# Patient Record
Sex: Female | Born: 1950 | Race: White | Hispanic: No | Marital: Married | Smoking: Never smoker
Health system: Southern US, Community
[De-identification: ages and names within clinical notes are randomized; demographics above are authoritative.]

## PROBLEM LIST (undated history)

## (undated) DIAGNOSIS — K5790 Diverticulosis of intestine, part unspecified, without perforation or abscess without bleeding: Secondary | ICD-10-CM

## (undated) DIAGNOSIS — S0300XA Dislocation of jaw, unspecified side, initial encounter: Secondary | ICD-10-CM

## (undated) DIAGNOSIS — K219 Gastro-esophageal reflux disease without esophagitis: Secondary | ICD-10-CM

## (undated) DIAGNOSIS — N3010 Interstitial cystitis (chronic) without hematuria: Secondary | ICD-10-CM

## (undated) DIAGNOSIS — E079 Disorder of thyroid, unspecified: Secondary | ICD-10-CM

## (undated) DIAGNOSIS — E039 Hypothyroidism, unspecified: Secondary | ICD-10-CM

## (undated) DIAGNOSIS — E7800 Pure hypercholesterolemia, unspecified: Secondary | ICD-10-CM

## (undated) DIAGNOSIS — M1990 Unspecified osteoarthritis, unspecified site: Secondary | ICD-10-CM

## (undated) DIAGNOSIS — N3289 Other specified disorders of bladder: Secondary | ICD-10-CM

## (undated) DIAGNOSIS — G4730 Sleep apnea, unspecified: Secondary | ICD-10-CM

## (undated) DIAGNOSIS — J45909 Unspecified asthma, uncomplicated: Secondary | ICD-10-CM

## (undated) DIAGNOSIS — M797 Fibromyalgia: Secondary | ICD-10-CM

## (undated) HISTORY — DX: Disorder of thyroid, unspecified: E07.9

## (undated) HISTORY — DX: Gastro-esophageal reflux disease without esophagitis: K21.9

## (undated) HISTORY — DX: Fibromyalgia: M79.7

## (undated) HISTORY — PX: ABDOMINAL HYSTERECTOMY: SHX81

## (undated) HISTORY — DX: Diverticulosis of intestine, part unspecified, without perforation or abscess without bleeding: K57.90

## (undated) HISTORY — PX: CERVICAL FUSION: SHX112

## (undated) HISTORY — DX: Interstitial cystitis (chronic) without hematuria: N30.10

## (undated) HISTORY — PX: BREAST BIOPSY: SHX20

## (undated) HISTORY — PX: BREAST SURGERY: SHX581

## (undated) HISTORY — DX: Other specified disorders of bladder: N32.89

---

## 1998-06-20 ENCOUNTER — Other Ambulatory Visit: Admission: RE | Admit: 1998-06-20 | Discharge: 1998-06-20 | Payer: Self-pay | Admitting: Obstetrics and Gynecology

## 1998-08-13 ENCOUNTER — Encounter: Payer: Self-pay | Admitting: Neurosurgery

## 1998-08-13 ENCOUNTER — Ambulatory Visit (HOSPITAL_COMMUNITY): Admission: RE | Admit: 1998-08-13 | Discharge: 1998-08-13 | Payer: Self-pay | Admitting: Neurosurgery

## 1999-09-17 ENCOUNTER — Encounter: Payer: Self-pay | Admitting: Neurology

## 1999-09-17 ENCOUNTER — Ambulatory Visit (HOSPITAL_COMMUNITY): Admission: RE | Admit: 1999-09-17 | Discharge: 1999-09-17 | Payer: Self-pay | Admitting: Neurology

## 2000-06-25 ENCOUNTER — Ambulatory Visit (HOSPITAL_COMMUNITY): Admission: RE | Admit: 2000-06-25 | Discharge: 2000-06-25 | Payer: Self-pay | Admitting: Obstetrics and Gynecology

## 2000-06-25 ENCOUNTER — Encounter: Payer: Self-pay | Admitting: Obstetrics and Gynecology

## 2001-09-28 ENCOUNTER — Encounter: Payer: Self-pay | Admitting: Obstetrics and Gynecology

## 2001-09-28 ENCOUNTER — Ambulatory Visit (HOSPITAL_COMMUNITY): Admission: RE | Admit: 2001-09-28 | Discharge: 2001-09-28 | Payer: Self-pay | Admitting: Obstetrics and Gynecology

## 2001-10-15 ENCOUNTER — Other Ambulatory Visit: Admission: RE | Admit: 2001-10-15 | Discharge: 2001-10-15 | Payer: Self-pay | Admitting: Obstetrics and Gynecology

## 2002-11-15 ENCOUNTER — Ambulatory Visit (HOSPITAL_COMMUNITY): Admission: RE | Admit: 2002-11-15 | Discharge: 2002-11-15 | Payer: Self-pay | Admitting: Obstetrics and Gynecology

## 2002-11-15 ENCOUNTER — Encounter: Payer: Self-pay | Admitting: Obstetrics and Gynecology

## 2004-03-15 ENCOUNTER — Ambulatory Visit (HOSPITAL_COMMUNITY): Admission: RE | Admit: 2004-03-15 | Discharge: 2004-03-15 | Payer: Self-pay | Admitting: Obstetrics and Gynecology

## 2005-06-26 ENCOUNTER — Ambulatory Visit (HOSPITAL_COMMUNITY): Admission: RE | Admit: 2005-06-26 | Discharge: 2005-06-26 | Payer: Self-pay | Admitting: Obstetrics and Gynecology

## 2005-07-08 ENCOUNTER — Other Ambulatory Visit: Admission: RE | Admit: 2005-07-08 | Discharge: 2005-07-08 | Payer: Self-pay | Admitting: Obstetrics and Gynecology

## 2007-01-14 ENCOUNTER — Ambulatory Visit (HOSPITAL_COMMUNITY): Admission: RE | Admit: 2007-01-14 | Discharge: 2007-01-14 | Payer: Self-pay | Admitting: Obstetrics and Gynecology

## 2007-03-17 ENCOUNTER — Ambulatory Visit (HOSPITAL_BASED_OUTPATIENT_CLINIC_OR_DEPARTMENT_OTHER): Admission: RE | Admit: 2007-03-17 | Discharge: 2007-03-17 | Payer: Self-pay | Admitting: Urology

## 2007-03-17 ENCOUNTER — Encounter (INDEPENDENT_AMBULATORY_CARE_PROVIDER_SITE_OTHER): Payer: Self-pay | Admitting: Urology

## 2007-12-24 ENCOUNTER — Ambulatory Visit: Payer: Self-pay | Admitting: Gastroenterology

## 2008-01-28 ENCOUNTER — Ambulatory Visit: Payer: Self-pay | Admitting: Gastroenterology

## 2008-02-03 ENCOUNTER — Ambulatory Visit (HOSPITAL_COMMUNITY): Admission: RE | Admit: 2008-02-03 | Discharge: 2008-02-03 | Payer: Self-pay | Admitting: Obstetrics and Gynecology

## 2009-02-18 ENCOUNTER — Inpatient Hospital Stay (HOSPITAL_COMMUNITY): Admission: AD | Admit: 2009-02-18 | Discharge: 2009-02-20 | Payer: Self-pay | Admitting: Orthopedic Surgery

## 2010-02-28 ENCOUNTER — Ambulatory Visit (HOSPITAL_COMMUNITY): Admission: RE | Admit: 2010-02-28 | Discharge: 2010-02-28 | Payer: Self-pay | Admitting: Obstetrics and Gynecology

## 2010-12-31 ENCOUNTER — Ambulatory Visit (INDEPENDENT_AMBULATORY_CARE_PROVIDER_SITE_OTHER): Payer: 59 | Admitting: Urology

## 2010-12-31 DIAGNOSIS — N362 Urethral caruncle: Secondary | ICD-10-CM

## 2010-12-31 DIAGNOSIS — N3020 Other chronic cystitis without hematuria: Secondary | ICD-10-CM

## 2011-02-25 NOTE — Op Note (Signed)
NAMESHANEESE, TAIT NO.:  [id number]   MEDICAL RECORD NO.:  [id number]          PATIENT TYPE:  AMB   LOCATION:  [facility]                         FACILITY:  [facility]   PHYSICIAN:  Maretta Bees. Vonita Moss, M.D.DATE OF BIRTH:  1951/09/07   DATE OF PROCEDURE:  03/17/2007  DATE OF DISCHARGE:                               OPERATIVE REPORT   PREOPERATIVE DIAGNOSIS:  Rule out interstitial cystitis.   POSTOPERATIVE DIAGNOSES:  1. Rule out interstitial cystitis.  2. Urethral stenosis.   PROCEDURE:  Cystoscopy, urethral dilation, hydraulic overdistention of  bladder and cold cup bladder biopsy.   SURGEON:  Dr. Larey Dresser.   ANESTHESIA:  General.   INDICATIONS:  This 60 year old lady has had a long history of bladder  pressure,  frequency and urgency and has a pelvic pain symptom score of  12. I have some concern about interstitial cystitis and she is brought  to the OR today for further evaluation and workup.   PROCEDURE:  The patient was brought to the operating room, placed in  lithotomy position, the external genitalia were prepped and draped in  the usual fashion.  She was cystoscoped but only after her urethra was  dilated with a 30-French. The bladder looked perfectly normal in looking  in and then I filled her to 850 mL and reexamination of the bladder  revealed scattered submucosal petechiae and some hemorrhage. Cold cup  bladder biopsies were obtained from hemorrhagic areas and the biopsy  sites fulgurated. The findings were consistent with interstitial  cystitis.  She was then taken to recovery room in good condition.      Maretta Bees. Vonita Moss, M.D.  Electronically Signed     LJP/MEDQ  D:  03/17/2007  T:  03/17/2007  Job:  [id number]

## 2011-02-25 NOTE — Discharge Summary (Signed)
NAMESHANTAY, SONN NO.:  [id number]   MEDICAL RECORD NO.:  [id number]          PATIENT TYPE:  INP   LOCATION:  1525                         FACILITY:  [facility]   PHYSICIAN:  Alvy Beal, MD    DATE OF BIRTH:  Dec 19, 1950   DATE OF ADMISSION:  02/18/2009  DATE OF DISCHARGE:  02/20/2009                               DISCHARGE SUMMARY   ADMISSION DIAGNOSIS:  Horrific low back pain with bilateral L5 pars  defects.   DISCHARGE DIAGNOSIS:  Same.   CONSULTATIONS:  None.   PROCEDURE:  Lumbar epidural steroid injection on Feb 19, 2009.   BRIEF HISTORY:  Ms. Delval is a very pleasant 60 year old female with on-  and-off again low back pain for a number of years.  She initially  presented to Dr. Shon Baton' office on Feb 15, 2009 with horrific back pain.  She at that time was started on some on narcotic pain medication and  unfortunately this did not provide her with any relief, therefore she  was admitted in to the hospital on Feb 18, 2009 for pain control and to  undergo a lumbar MRI.  Hospital day number day #1 patient did undergo a  lumbar MRI which was reviewed by Dr. Shon Baton, it demonstrated bilateral  pars defects at the L5 with the 2 mm of anterior listhesis with mild  foraminal narrowing that would possibly irritate the right L5 nerve  root.  Based off of these results Dr. Shon Baton recommended a lumbar  epidural steroid injection which was done later that evening.  Post  procedure day #1 which is hospital stay #3 on Feb 20, 2009 the patient  states her back pain was improved, her leg pain was improved.  She was  still having some occasional side pain but all-in-all she feels much  better.  The patient was fitted for a flexion molded LSO brace to help  reduce the slip at the L5 level.  Since the patient's vital signs  remained stable, she was afebrile, neurovascularly intact and her pain  was now controlled with oral medication and the lumbar epidural steroid  injection the patient was deemed stable to be discharged home.   DISCHARGE MEDICATIONS:  1. Hydrocodone 5/325 one every 6 hours as needed for pain.  2. Cyclobenzaprine 10 mg one tablet at night.  3. Levothyroxine 75 mcg one daily.  4. __________ one in the morning and one in the evening.  5. The patient is instructed to stop her prednisone Dosepak.   The patient had no medications upon discharge.   INSTRUCTIONS:  The patient is instructed on back precautions, no  bending, stooping, twisting or squatting.  She is not to lift anything  heavier than 6 pounds.  She may take her brace off to shower but then  she needs to place her brace back on.  She can increase activity slowly.  She may walk up steps.  She is to walk as often as tolerated.   FOLLOW-UP INSTRUCTIONS:  The patient is instructed to follow up with Dr.  Shon Baton in his office in approximately 1  week.  The patient will call our  office if she is having any increased pain, new radicular-type leg pain,  any fevers, chills, difficulty with bowel or bladder problems.  The  patient is instructed to schedule her follow-up appointment by calling  the office at [phone].      Crissie Reese, PA      Alvy Beal, MD  Electronically Signed    AC/MEDQ  D:  02/20/2009  T:  02/20/2009  Job:  [id number]

## 2011-07-31 LAB — POCT HEMOGLOBIN-HEMACUE: Hemoglobin: 12.8

## 2011-11-27 ENCOUNTER — Other Ambulatory Visit (INDEPENDENT_AMBULATORY_CARE_PROVIDER_SITE_OTHER): Payer: BC Managed Care – PPO

## 2011-11-27 ENCOUNTER — Encounter: Payer: Self-pay | Admitting: Physician Assistant

## 2011-11-27 ENCOUNTER — Telehealth: Payer: Self-pay | Admitting: Gastroenterology

## 2011-11-27 ENCOUNTER — Ambulatory Visit (INDEPENDENT_AMBULATORY_CARE_PROVIDER_SITE_OTHER): Payer: BC Managed Care – PPO | Admitting: Physician Assistant

## 2011-11-27 ENCOUNTER — Ambulatory Visit (INDEPENDENT_AMBULATORY_CARE_PROVIDER_SITE_OTHER)
Admission: RE | Admit: 2011-11-27 | Discharge: 2011-11-27 | Disposition: A | Discharge status: Home / Self Care | Payer: BC Managed Care – PPO | Source: Ambulatory Visit | Attending: Physician Assistant | Admitting: Physician Assistant

## 2011-11-27 VITALS — BP 132/64 | HR 92 | Temp 98.3°F | Ht 68.0 in | Wt 175.0 lb

## 2011-11-27 DIAGNOSIS — K5730 Diverticulosis of large intestine without perforation or abscess without bleeding: Secondary | ICD-10-CM

## 2011-11-27 DIAGNOSIS — R1032 Left lower quadrant pain: Secondary | ICD-10-CM

## 2011-11-27 DIAGNOSIS — N3010 Interstitial cystitis (chronic) without hematuria: Secondary | ICD-10-CM | POA: Insufficient documentation

## 2011-11-27 DIAGNOSIS — K5790 Diverticulosis of intestine, part unspecified, without perforation or abscess without bleeding: Secondary | ICD-10-CM | POA: Insufficient documentation

## 2011-11-27 LAB — BASIC METABOLIC PANEL
Calcium: 9.3 mg/dL (ref 8.4–10.5)
GFR: 64.03 mL/min (ref >60.00)
Glucose, Bld: 97 mg/dL (ref 70–99)
Potassium: 3.5 mEq/L (ref 3.5–5.1)
Sodium: 140 mEq/L (ref 135–145)

## 2011-11-27 LAB — CBC WITH DIFFERENTIAL/PLATELET
Basophils Absolute: 0 10*3/uL (ref 0.0–0.1)
Eosinophils Relative: 1.4 % (ref 0.0–5.0)
HCT: 37.5 % (ref 36.0–46.0)
Lymphs Abs: 3.1 10*3/uL (ref 0.7–4.0)
MCV: 89.7 fl (ref 78.0–100.0)
Monocytes Absolute: 1.2 10*3/uL — ABNORMAL HIGH (ref 0.1–1.0)
Monocytes Relative: 9.3 % (ref 3.0–12.0)
Neutrophils Relative %: 64.5 % (ref 43.0–77.0)
Platelets: 275 10*3/uL (ref 150.0–400.0)
RDW: 13.2 % (ref 11.5–14.6)
WBC: 12.8 10*3/uL — ABNORMAL HIGH (ref 4.5–10.5)

## 2011-11-27 MED ORDER — HYDROCODONE-ACETAMINOPHEN 5-500 MG PO TABS: 1.0000 | ORAL_TABLET | ORAL | Status: AC | PRN

## 2011-11-27 MED ORDER — CIPROFLOXACIN HCL 500 MG PO TABS: 500.0000 mg | ORAL_TABLET | Freq: Two times a day (BID) | ORAL | Status: AC

## 2011-11-27 MED ORDER — METRONIDAZOLE 500 MG PO TABS: 500.0000 mg | ORAL_TABLET | Freq: Two times a day (BID) | ORAL | Status: AC

## 2011-11-27 MED ORDER — IOHEXOL 300 MG/ML  SOLN
100.0000 mL | Freq: Once | INTRAMUSCULAR | Status: AC | PRN
  Administered 2011-11-27: 100 mL via INTRAVENOUS

## 2011-11-27 NOTE — Telephone Encounter (Signed)
Patient with a several day history of abdominal pain that has gotten progressively worse.  She reports mucus and blood in her stool, she is having problems walking due to the pain.  I have scheduled an appt for her today to be seen at 2:00 by Mike Gip PA.

## 2011-11-27 NOTE — Patient Instructions (Addendum)
  You have been scheduled for a CT scan of the abdomen and pelvis at [facility] ([address]---this is in the same building as [facility]).   You are scheduled on 11-27-11 at 4:00pm. You should arrive 15 minutes prior to your appointment time for registration. Please follow the written instructions below on the day of your exam:  WARNING: IF YOU ARE ALLERGIC TO IODINE/X-RAY DYE, PLEASE NOTIFY RADIOLOGY IMMEDIATELY AT [phone]! YOU WILL BE GIVEN A 13 HOUR PREMEDICATION PREP.  1) Do not eat or drink anything after 2:45pm.  2) You have been given 2 bottles of oral contrast to drink. The solution may taste better if refrigerated, but do NOT add ice or any other liquid to this solution. Shake  well before drinking.    Drink 1 bottle of contrast @ 2:45pm (2 hours prior to your exam)  Drink 1 bottle of contrast @ 3:45pm (1 hour prior to your exam)  You may take any medications as prescribed with a small amount of water except for the following: Metformin, Glucophage, Glucovance, Avandamet, Riomet, Fortamet, Actoplus Met, Janumet, Glumetza or Metaglip. The above medications must be held the day of the exam AND 48 hours after the exam. Please go directly to the lab for lab work to be done.    The purpose of you drinking the oral contrast is to aid in the visualization of your intestinal tract. The contrast solution may cause some diarrhea. Before your exam is started, you will be given a small amount of fluid to drink. Depending on your individual set of symptoms, you may also receive an intravenous injection of x-ray contrast/dye. Plan on being at [facility] for 30 minutes or long, depending on the type of exam you are having performed.  If you have any questions regarding your exam or if you need to reschedule, you may call the CT department at [phone] between the hours of 8:00 am and 5:00 pm, Monday-Friday.  We have sent the antibiotic prescriptions to your  pharmacy and given you a printed prescription for Vicodin to take with you. ________________________________________________________________________

## 2011-11-27 NOTE — Progress Notes (Signed)
Subjective:    Patient ID: Kathleen Wall, female    DOB: 10-Jul-1951, 61 y.o.   MRN: [id number]  HPI Amberlin is a pleasant3 year old female known to Dr. Russella Dar who underwent colonoscopy in April of 2009 for screening. This was normal with the exception of sigmoid colon diverticulosis. We have not seen her since then. She called earlier today after acute onset last night with sharp lower abdominal pain followed by passage of a mucousy stool with a small amount of blood noted on the tissue. She had a second episode in that time, again with a small amount of mucus and blood on the tissue. She continued to hurt last night and says that she is very uncomfortable with walking. She has not had any documented fever or chills no nausea or vomiting but her appetite has been somewhat decreased. Patient says that she has history of interstitial cystitis and when her symptoms started last night she thought it was her bladder but since feels that her pain is different. She had started her medication for the interstitial cystitis and that did not help either.  Patient is status post complete hysterectomy and has history of endometriosis as well.    Review of Systems  Constitutional: Positive for appetite change.  HENT: Negative for congestion.   Eyes: Negative for discharge.  Respiratory: Negative.   Gastrointestinal: Positive for abdominal pain, diarrhea and blood in stool.  Genitourinary: Positive for dysuria.  Musculoskeletal: Negative.   Neurological: Negative.   Hematological: Negative.   Psychiatric/Behavioral: Negative.    Outpatient Encounter Prescriptions as of 11/27/2011  Medication Sig Dispense Refill  . AMBULATORY NON FORMULARY MEDICATION Uralac Takes as directed      . ASPIRIN PO Take 1 tablet by mouth as directed.      Vedia Coffer Cohosh (REMIFEMIN PO) Take by mouth as directed.      Marland Kitchen CALCIUM PO Take 1 tablet by mouth daily.      . Cyclobenzaprine HCl (FLEXERIL PO) Take 1 tablet by mouth at  bedtime.      Marland Kitchen levothyroxine (SYNTHROID, LEVOTHROID) 75 MCG tablet Take 75 mcg by mouth daily.      . Omega-3 Fatty Acids (FISH OIL PO) Take 1 capsule by mouth daily.      . Red Yeast Rice Extract (RED YEAST RICE PO) Take 1 capsule by mouth daily.      . ciprofloxacin (CIPRO) 500 MG tablet Take 1 tablet (500 mg total) by mouth 2 (two) times daily.  20 tablet  0  . HYDROcodone-acetaminophen (VICODIN) 5-500 MG per tablet Take 1 tablet by mouth every 4 (four) hours as needed for pain.  20 tablet  0  . metroNIDAZOLE (FLAGYL) 500 MG tablet Take 1 tablet (500 mg total) by mouth 2 (two) times daily with a meal.  21 tablet  0    No Known Allergies Active Ambulatory Problems    Diagnosis Date Noted  . Diverticulosis 11/27/2011  . Interstitial cystitis 11/27/2011   Resolved Ambulatory Problems    Diagnosis Date Noted  . No Resolved Ambulatory Problems   Past Medical History  Diagnosis Date  . Diverticular disease   . Thyroid disease   . Irritable bladder        Objective:   Physical Exam white female in no acute distress, alert and oriented x3 pleasant. Temp 90 833 ;nontraumatic ,normocephalic, EOMI, PERRLA, sclera anicteric,neck; Supple no JVD, Cardiovascular; regular rate and rhythm with S1-S2 no murmur gallop, Pulmonary; clear bilaterally, Abdomen; soft bowel sounds are  present but somewhat hypoactive she is quite tender in the left lower quadrant and suprapubic area, no guarding, positive rebound, no palpable masses or hepatosplenomegaly, Rectal; not done, Extremities; no clubbing cyanosis or edema, Psych; mood and affect normal and appropriate        Assessment & Plan:  #30 61 year old female with acute left lower quadrant and suprapubic pain onset yesterday. I suspect she has an acute diverticulitis with some peritoneal irritation given rebound on exam. She states that she passed a small amount of blood with her bowel movement last evening but has not had any evidence of any active  bleeding.  Plan; check CBC and BMET now. Schedule for CT scan of abdomen and pelvis with in the next 24 hours  Patient to start a soft bland diet Vicodin 5 501 every 6 hours as needed for pain Start Cipro 500 mg by mouth twice daily x14 days and Flagyl 500 mg by mouth twice daily x14 days. Further plans pending results of CT.  #2 previously documented diverticulosis #3 history of interstitial cystitis

## 2011-11-28 ENCOUNTER — Other Ambulatory Visit: Payer: BC Managed Care – PPO

## 2011-11-28 NOTE — Progress Notes (Signed)
Agree with initial assessment and plans. Most likely diverticulitis vs ischemic colitis. Agree w/ antibiotics and CT

## 2011-12-01 ENCOUNTER — Telehealth: Payer: Self-pay

## 2011-12-01 NOTE — Telephone Encounter (Signed)
Message copied by Annett Fabian on Mon Dec 01, 2011  9:41 AM ------      Message from: [place], [place] S      Created: Thu Nov 27, 2011  4:54 PM       Breck Hollinger-pt has acute diverticulitis- please call her on Monday to make sure she is  Feeling better- thanks

## 2011-12-01 NOTE — Telephone Encounter (Signed)
Patient reports her symptoms are improving daily.  She is still having some left lower quad discomfort, but not using the vicodin at work.  She was asking what she can take.  She is advised she can use OTC pain meds.  She is asked to call back if her symptoms worsen or don't resolve by the time she completes her antibiotics.  She is having some constipation, she is advised she can take MOM or a stool softener.  She is tolerating a regular diet.

## 2012-01-06 ENCOUNTER — Other Ambulatory Visit (HOSPITAL_COMMUNITY): Payer: Self-pay | Admitting: Obstetrics and Gynecology

## 2012-01-06 DIAGNOSIS — Z1231 Encounter for screening mammogram for malignant neoplasm of breast: Secondary | ICD-10-CM

## 2012-01-07 ENCOUNTER — Ambulatory Visit (HOSPITAL_COMMUNITY): Payer: BC Managed Care – PPO

## 2012-01-07 ENCOUNTER — Ambulatory Visit (HOSPITAL_COMMUNITY)
Admission: RE | Admit: 2012-01-07 | Discharge: 2012-01-07 | Disposition: A | Discharge status: Home / Self Care | Payer: BC Managed Care – PPO | Source: Ambulatory Visit | Attending: Obstetrics and Gynecology | Admitting: Obstetrics and Gynecology

## 2012-01-07 DIAGNOSIS — Z1231 Encounter for screening mammogram for malignant neoplasm of breast: Secondary | ICD-10-CM | POA: Insufficient documentation

## 2013-02-18 ENCOUNTER — Other Ambulatory Visit: Payer: Self-pay | Admitting: Obstetrics and Gynecology

## 2014-06-27 ENCOUNTER — Ambulatory Visit (INDEPENDENT_AMBULATORY_CARE_PROVIDER_SITE_OTHER): Payer: BC Managed Care – PPO | Admitting: Gastroenterology

## 2014-06-27 ENCOUNTER — Encounter: Payer: Self-pay | Admitting: Gastroenterology

## 2014-06-27 VITALS — BP 140/80 | HR 74 | Ht 67.0 in | Wt 175.6 lb

## 2014-06-27 DIAGNOSIS — R1319 Other dysphagia: Secondary | ICD-10-CM

## 2014-06-27 DIAGNOSIS — K219 Gastro-esophageal reflux disease without esophagitis: Secondary | ICD-10-CM

## 2014-06-27 DIAGNOSIS — Z800 Family history of malignant neoplasm of digestive organs: Secondary | ICD-10-CM

## 2014-06-27 DIAGNOSIS — R109 Unspecified abdominal pain: Secondary | ICD-10-CM

## 2014-06-27 DIAGNOSIS — K921 Melena: Secondary | ICD-10-CM

## 2014-06-27 MED ORDER — PEG-KCL-NACL-NASULF-NA ASC-C 100 G PO SOLR: 1.0000 | Freq: Once | ORAL | Status: DC

## 2014-06-27 NOTE — Patient Instructions (Addendum)
You have been scheduled for an endoscopy and colonoscopy. Please follow the written instructions given to you at your visit today. Please pick up your prep at the pharmacy within the next 1-3 days. If you use inhalers (even only as needed), please bring them with you on the day of your procedure. Your physician has requested that you go to www.startemmi.com and enter the access code given to you at your visit today. This web site gives a general overview about your procedure. However, you should still follow specific instructions given to you by our office regarding your preparation for the procedure.  Start taking your omeprazole 20 mg daily that you have already purchased and is at your home.  Thank you for choosing me and [facility].  Pricilla Riffle. Dagoberto Ligas., MD., Marval Regal  cc: Rory Percy, MD

## 2014-06-27 NOTE — Progress Notes (Signed)
    History of Present Illness: This is a 63 year old female referred by Dr. Nadara Mustard with multiple GI complaints. She notes frequent dull pain in her suprapubic area, occasional alternating diarrhea and constipation, occasional small volume rectal bleeding, occasional solid food dysphagia and frequent lower sternal pain. In addition her brother was recently diagnosed with stage IV colon cancer age 61. She previously underwent colonoscopy in April 2009 which was normal except for mild sigmoid colon diverticulosis. She takes omeprazole as needed which is helpful for her lower sternal pain. She has had intermittent solid food dysphagia for the past 3 or 4 months. Denies weight loss, change in stool caliber, melena, hematochezia, nausea, vomiting.   Current Medications, Allergies, Past Medical History, Past Surgical History, Family History and Social History were reviewed in [employer] record.  Physical Exam: General: Well developed , well nourished, no acute distress Head: Normocephalic and atraumatic Eyes:  sclerae anicteric, EOMI Ears: Normal auditory acuity Mouth: No deformity or lesions Lungs: Clear throughout to auscultation Heart: Regular rate and rhythm; no murmurs, rubs or bruits Abdomen: Soft, non tender and non distended. No masses, hepatosplenomegaly or hernias noted. Normal Bowel sounds Rectal: Deferred to colonoscopy  Musculoskeletal: Symmetrical with no gross deformities  Pulses:  Normal pulses noted Extremities: No clubbing, cyanosis, edema or deformities noted Neurological: Alert oriented x 4, grossly nonfocal Psychological:  Alert and cooperative. Normal mood and affect  Assessment and Recommendations:  1. Family history of colon cancer, small-volume hematochezia, variable bowel habits, suprapubic pain. Her suprapubic pain may be related to interstitial cystitis. Schedule colonoscopy. The risks, benefits, and alternatives to colonoscopy with possible  biopsy and possible polypectomy were discussed with the patient and they consent to proceed.   2. GERD with new onset solid food dysphagia. Rule out esophagitis, stricture. Take omeprazole 20 mg daily. Begin standard antireflux measures. Schedule upper endoscopy possible dilation. The risks, benefits, and alternatives to endoscopy with possible biopsy and possible dilation were discussed with the patient and they consent to proceed.

## 2014-07-05 ENCOUNTER — Ambulatory Visit (AMBULATORY_SURGERY_CENTER): Payer: BC Managed Care – PPO | Admitting: Gastroenterology

## 2014-07-05 ENCOUNTER — Encounter: Payer: Self-pay | Admitting: Gastroenterology

## 2014-07-05 ENCOUNTER — Encounter: Payer: BC Managed Care – PPO | Admitting: Gastroenterology

## 2014-07-05 VITALS — BP 156/70 | HR 66 | Temp 98.0°F | Resp 21 | Ht 67.0 in | Wt 175.0 lb

## 2014-07-05 DIAGNOSIS — K921 Melena: Secondary | ICD-10-CM

## 2014-07-05 DIAGNOSIS — Z800 Family history of malignant neoplasm of digestive organs: Secondary | ICD-10-CM

## 2014-07-05 DIAGNOSIS — K219 Gastro-esophageal reflux disease without esophagitis: Secondary | ICD-10-CM

## 2014-07-05 DIAGNOSIS — R1319 Other dysphagia: Secondary | ICD-10-CM

## 2014-07-05 DIAGNOSIS — K222 Esophageal obstruction: Secondary | ICD-10-CM

## 2014-07-05 MED ORDER — SODIUM CHLORIDE 0.9 % IV SOLN: 500.0000 mL | INTRAVENOUS | Status: DC

## 2014-07-05 NOTE — Op Note (Signed)
[place]  [address] [place], [id number]   COLONOSCOPY PROCEDURE REPORT  PATIENT: Kathleen, Wall  MR#: [id number] BIRTHDATE: 12-04-1950 , 12  yrs. old GENDER: female ENDOSCOPIST: Ladene Artist, MD, [facility] PROCEDURE DATE:  07/05/2014 PROCEDURE:   Colonoscopy, diagnostic First Screening Colonoscopy - Avg.  risk and is 50 yrs.  old or older - No.  Prior Negative Screening - Now for repeat screening. N/A  History of Adenoma - Now for follow-up colonoscopy & has been > or = to 3 yrs.  N/A  Polyps Removed Today? No.  Recommend repeat exam, <10 yrs? Polyps Removed Today? No.  Recommend repeat exam, <10 yrs? Yes.  High risk (family or personal hx). ASA CLASS:   Class II INDICATIONS:hematochezia and patient's immediate family history of colon cancer. MEDICATIONS: Monitored anesthesia care and Propofol 200 mg DESCRIPTION OF PROCEDURE:   After the risks benefits and alternatives of the procedure were thoroughly explained, informed consent was obtained.  The digital rectal exam revealed no abnormalities of the rectum.   The LB [id number]  endoscope was introduced through the anus and advanced to the cecum, which was identified by both the appendix and ileocecal valve. No adverse events experienced.   The quality of the prep was good, using MoviPrep  The instrument was then slowly withdrawn as the colon was fully examined.    COLON FINDINGS: There was moderate diverticulosis noted in the sigmoid colon and descending colon.   The colon mucosa was otherwise normal.  Retroflexed views revealed small external hemorrhoids. The time to cecum=2 minutes 18 seconds.  Withdrawal time=8 minutes 40 seconds.  The scope was withdrawn and the procedure completed.  COMPLICATIONS: There were no complications.   ENDOSCOPIC IMPRESSION: 1.   Moderate diverticulosis in the sigmoid colon and descending colon 2.   Small external hemorrhoids  RECOMMENDATIONS: 1.  High fiber  diet with liberal fluid intake. 2.  Repeat Colonoscopy in 5 years.  eSigned:  Ladene Artist, MD, [facility] 07/05/2014 1:47 PM   cc: Rory Percy, MD   PATIENT NAME:  Kathleen, Wall MR#: [id number]

## 2014-07-05 NOTE — Progress Notes (Signed)
Called to room to assist during endoscopic procedure.  Patient ID and intended procedure confirmed with present staff. Received instructions for my participation in the procedure from the performing physician.  

## 2014-07-05 NOTE — Op Note (Signed)
[place]  [address]. [place], [id number]   ENDOSCOPY PROCEDURE REPORT  PATIENT: Kathleen Wall, Kathleen Wall  MR#: [id number] BIRTHDATE: 1951/07/24 , 23  yrs. old GENDER: female ENDOSCOPIST: Ladene Artist, MD, [facility] PROCEDURE DATE:  07/05/2014 PROCEDURE:  EGD w/ wire guided (savary) dilation ASA CLASS:     Class II INDICATIONS:  dysphagia and history of esophageal reflux. MEDICATIONS: Monitored anesthesia care and Propofol 100 mg and residual sedation present TOPICAL ANESTHETIC: none DESCRIPTION OF PROCEDURE: After the risks benefits and alternatives of the procedure were thoroughly explained, informed consent was obtained.  The LB [id number] endoscope was introduced through the mouth and advanced to the second portion of the duodenum , limited by Without limitations. The instrument was slowly withdrawn as the mucosa was fully examined.  ESOPHAGUS: There was a mild and benign appearing stricture with an inner diameter of 15 mm at the gastroesophageal junction.  The stricture was easily traversable.  The stricture was dilated using a 59mm (51Fr) savary dilator over guidewire.   The esophagus was otherwise normal. STOMACH: The mucosa and folds of the stomach appeared normal. DUODENUM: The duodenal mucosa showed no abnormalities in the bulb and 2nd part of the duodenum.  Retroflexed views revealed a small hiatal hernia.  The scope was then withdrawn from the patient and the procedure completed.  COMPLICATIONS: There were no complications.  ENDOSCOPIC IMPRESSION: 1.   Stricture at the gastroesophageal junction; dilated using a savary dilator over guidewire 2.   Small hiatal hernia  RECOMMENDATIONS: 1.  Anti-reflux regimen long term 2.  Continue PPI daily long term 3.  Post dilation instructions  eSigned:  Ladene Artist, MD, [facility] 07/05/2014 1:58 PM   UY:EBXID Nadara Mustard, MD

## 2014-07-05 NOTE — Progress Notes (Signed)
Report to PACU, RN, vss, BBS= Clear.  

## 2014-07-05 NOTE — Patient Instructions (Signed)
YOU HAD AN ENDOSCOPIC PROCEDURE TODAY AT THE [facility]: Refer to the procedure report that was given to you for any specific questions about what was found during the examination.  If the procedure report does not answer your questions, please call your gastroenterologist to clarify.  If you requested that your care partner not be given the details of your procedure findings, then the procedure report has been included in a sealed envelope for you to review at your convenience later.  YOU SHOULD EXPECT: Some feelings of bloating in the abdomen. Passage of more gas than usual.  Walking can help get rid of the air that was put into your GI tract during the procedure and reduce the bloating. If you had a lower endoscopy (such as a colonoscopy or flexible sigmoidoscopy) you may notice spotting of blood in your stool or on the toilet paper. If you underwent a bowel prep for your procedure, then you may not have a normal bowel movement for a few days.  DIET: Your first meal following the procedure should be a light meal and then it is ok to progress to your normal diet.  A half-sandwich or bowl of soup is an example of a good first meal.  Heavy or fried foods are harder to digest and may make you feel nauseous or bloated.  Likewise meals heavy in dairy and vegetables can cause extra gas to form and this can also increase the bloating.  Drink plenty of fluids but you should avoid alcoholic beverages for 24 hours.  ACTIVITY: Your care partner should take you home directly after the procedure.  You should plan to take it easy, moving slowly for the rest of the day.  You can resume normal activity the day after the procedure however you should NOT DRIVE or use heavy machinery for 24 hours (because of the sedation medicines used during the test).    SYMPTOMS TO REPORT IMMEDIATELY: A gastroenterologist can be reached at any hour.  During normal business hours, 8:30 AM to 5:00 PM Monday through Friday,  call [phone].  After hours and on weekends, please call the GI answering service at [phone] who will take a message and have the physician on call contact you.   Following lower endoscopy (colonoscopy or flexible sigmoidoscopy):  Excessive amounts of blood in the stool  Significant tenderness or worsening of abdominal pains  Swelling of the abdomen that is new, acute  Fever of 100F or higher  Following upper endoscopy (EGD)  Vomiting of blood or coffee ground material  New chest pain or pain under the shoulder blades  Painful or persistently difficult swallowing  New shortness of breath  Fever of 100F or higher  Black, tarry-looking stools  FOLLOW UP: If any biopsies were taken you will be contacted by phone or by letter within the next 1-3 weeks.  Call your gastroenterologist if you have not heard about the biopsies in 3 weeks.  Our staff will call the home number listed on your records the next business day following your procedure to check on you and address any questions or concerns that you may have at that time regarding the information given to you following your procedure. This is a courtesy call and so if there is no answer at the home number and we have not heard from you through the emergency physician on call, we will assume that you have returned to your regular daily activities without incident.  SIGNATURES/CONFIDENTIALITY: You and/or your care   partner have signed paperwork which will be entered into your electronic medical record.  These signatures attest to the fact that that the information above on your After Visit Summary has been reviewed and is understood.  Full responsibility of the confidentiality of this discharge information lies with you and/or your care-partner.   Anti reflux information and dilatation diet given to you today  Information on hemorrhoids & diverticulosis given to you today

## 2014-07-06 ENCOUNTER — Telehealth: Payer: Self-pay | Admitting: *Deleted

## 2014-07-06 NOTE — Telephone Encounter (Signed)
  Follow up Call-  Call back number 07/05/2014  Post procedure Call Back phone  # [phone]  Permission to leave phone message Yes     Patient questions:  Do you have a fever, pain , or abdominal swelling? No. Pain Score  0 *  Have you tolerated food without any problems? No.  Have you been able to return to your normal activities? No.  Do you have any questions about your discharge instructions: Diet   No. Medications  No. Follow up visit  No.  Do you have questions or concerns about your Care? No.  Actions: * If pain score is 4 or above: No action needed, pain <4.  Pt mentioned that she has a sore throat this morning.  Advised her that she can take Tylenol.

## 2016-04-02 ENCOUNTER — Other Ambulatory Visit: Payer: Self-pay | Admitting: Obstetrics and Gynecology

## 2016-04-02 DIAGNOSIS — N6452 Nipple discharge: Secondary | ICD-10-CM

## 2016-04-02 DIAGNOSIS — E2839 Other primary ovarian failure: Secondary | ICD-10-CM

## 2016-04-08 ENCOUNTER — Ambulatory Visit
Admission: RE | Admit: 2016-04-08 | Discharge: 2016-04-08 | Disposition: A | Discharge status: Home / Self Care | Payer: BLUE CROSS/BLUE SHIELD | Source: Ambulatory Visit | Attending: Obstetrics and Gynecology | Admitting: Obstetrics and Gynecology

## 2016-04-08 ENCOUNTER — Other Ambulatory Visit: Payer: Self-pay

## 2016-04-08 DIAGNOSIS — N6452 Nipple discharge: Secondary | ICD-10-CM

## 2016-05-16 ENCOUNTER — Ambulatory Visit
Admission: RE | Admit: 2016-05-16 | Discharge: 2016-05-16 | Disposition: A | Discharge status: Home / Self Care | Payer: BLUE CROSS/BLUE SHIELD | Source: Ambulatory Visit | Attending: Obstetrics and Gynecology | Admitting: Obstetrics and Gynecology

## 2016-05-16 DIAGNOSIS — E2839 Other primary ovarian failure: Secondary | ICD-10-CM

## 2016-08-19 NOTE — Patient Instructions (Signed)
Your procedure is scheduled on: 08/25/2016   Report to [facility] at 42   AM.  Call this number if you have problems the morning of surgery: [phone]   Do not eat food or drink liquids :After Midnight.      Take these medicines the morning of surgery with A SIP OF WATER: levothyroxine, lodine. Take your inhaler before you come.   Do not wear jewelry, make-up or nail polish.  Do not wear lotions, powders, or perfumes. You may wear deodorant.  Do not shave 48 hours prior to surgery.  Do not bring valuables to the hospital.  Contacts, dentures or bridgework may not be worn into surgery.  Leave suitcase in the car. After surgery it may be brought to your room.  For patients admitted to the hospital, checkout time is 11:00 AM the day of discharge.   Patients discharged the day of surgery will not be allowed to drive home.  :     Please read over the following fact sheets that you were given: Coughing and Deep Breathing, Surgical Site Infection Prevention, Anesthesia Post-op Instructions and Care and Recovery After Surgery    Cataract A cataract is a clouding of the lens of the eye. When a lens becomes cloudy, vision is reduced based on the degree and nature of the clouding. Many cataracts reduce vision to some degree. Some cataracts make people more near-sighted as they develop. Other cataracts increase glare. Cataracts that are ignored and become worse can sometimes look white. The white color can be seen through the pupil. CAUSES   Aging. However, cataracts may occur at any age, even in newborns.   Certain drugs.   Trauma to the eye.   Certain diseases such as diabetes.   Specific eye diseases such as chronic inflammation inside the eye or a sudden attack of a rare form of glaucoma.   Inherited or acquired medical problems.  SYMPTOMS   Gradual, progressive drop in vision in the affected eye.   Severe, rapid visual loss. This most often happens when trauma is the cause.    DIAGNOSIS  To detect a cataract, an eye doctor examines the lens. Cataracts are best diagnosed with an exam of the eyes with the pupils enlarged (dilated) by drops.  TREATMENT  For an early cataract, vision may improve by using different eyeglasses or stronger lighting. If that does not help your vision, surgery is the only effective treatment. A cataract needs to be surgically removed when vision loss interferes with your everyday activities, such as driving, reading, or watching TV. A cataract may also have to be removed if it prevents examination or treatment of another eye problem. Surgery removes the cloudy lens and usually replaces it with a substitute lens (intraocular lens, IOL).  At a time when both you and your doctor agree, the cataract will be surgically removed. If you have cataracts in both eyes, only one is usually removed at a time. This allows the operated eye to heal and be out of danger from any possible problems after surgery (such as infection or poor wound healing). In rare cases, a cataract may be doing damage to your eye. In these cases, your caregiver may advise surgical removal right away. The vast majority of people who have cataract surgery have better vision afterward. HOME CARE INSTRUCTIONS  If you are not planning surgery, you may be asked to do the following:  Use different eyeglasses.   Use stronger or brighter lighting.   Ask  your eye doctor about reducing your medicine dose or changing medicines if it is thought that a medicine caused your cataract. Changing medicines does not make the cataract go away on its own.   Become familiar with your surroundings. Poor vision can lead to injury. Avoid bumping into things on the affected side. You are at a higher risk for tripping or falling.   Exercise extreme care when driving or operating machinery.   Wear sunglasses if you are sensitive to bright light or experiencing problems with glare.  SEEK IMMEDIATE MEDICAL CARE  IF:   You have a worsening or sudden vision loss.   You notice redness, swelling, or increasing pain in the eye.   You have a fever.  Document Released: 09/29/2005 Document Revised: 09/18/2011 Document Reviewed: 05/23/2011 [facility] Patient Information 2012 [place].PATIENT INSTRUCTIONS POST-ANESTHESIA  IMMEDIATELY FOLLOWING SURGERY:  Do not drive or operate machinery for the first twenty four hours after surgery.  Do not make any important decisions for twenty four hours after surgery or while taking narcotic pain medications or sedatives.  If you develop intractable nausea and vomiting or a severe headache please notify your doctor immediately.  FOLLOW-UP:  Please make an appointment with your surgeon as instructed. You do not need to follow up with anesthesia unless specifically instructed to do so.  WOUND CARE INSTRUCTIONS (if applicable):  Keep a dry clean dressing on the anesthesia/puncture wound site if there is drainage.  Once the wound has quit draining you may leave it open to air.  Generally you should leave the bandage intact for twenty four hours unless there is drainage.  If the epidural site drains for more than 36-48 hours please call the anesthesia department.  QUESTIONS?:  Please feel free to call your physician or the hospital operator if you have any questions, and they will be happy to assist you.

## 2016-08-21 ENCOUNTER — Encounter (HOSPITAL_COMMUNITY)
Admission: RE | Admit: 2016-08-21 | Discharge: 2016-08-21 | Disposition: A | Discharge status: Home / Self Care | Payer: BLUE CROSS/BLUE SHIELD | Source: Ambulatory Visit | Attending: Ophthalmology | Admitting: Ophthalmology

## 2016-08-21 ENCOUNTER — Encounter (HOSPITAL_COMMUNITY): Payer: Self-pay

## 2016-08-21 DIAGNOSIS — Z01812 Encounter for preprocedural laboratory examination: Secondary | ICD-10-CM | POA: Insufficient documentation

## 2016-08-21 DIAGNOSIS — H2512 Age-related nuclear cataract, left eye: Secondary | ICD-10-CM | POA: Insufficient documentation

## 2016-08-21 DIAGNOSIS — R9431 Abnormal electrocardiogram [ECG] [EKG]: Secondary | ICD-10-CM | POA: Insufficient documentation

## 2016-08-21 DIAGNOSIS — Z0183 Encounter for blood typing: Secondary | ICD-10-CM | POA: Insufficient documentation

## 2016-08-21 HISTORY — DX: Unspecified osteoarthritis, unspecified site: M19.90

## 2016-08-21 HISTORY — DX: Unspecified asthma, uncomplicated: J45.909

## 2016-08-21 HISTORY — DX: Pure hypercholesterolemia, unspecified: E78.00

## 2016-08-21 HISTORY — DX: Sleep apnea, unspecified: G47.30

## 2016-08-21 HISTORY — DX: Hypothyroidism, unspecified: E03.9

## 2016-08-21 HISTORY — DX: Dislocation of jaw, unspecified side, initial encounter: S03.00XA

## 2016-08-21 LAB — CBC
HCT: 41.1 % (ref 36.0–46.0)
Hemoglobin: 13.3 g/dL (ref 12.0–15.0)
MCH: 29.4 pg (ref 26.0–34.0)
MCHC: 32.4 g/dL (ref 30.0–36.0)
MCV: 90.9 fL (ref 78.0–100.0)
PLATELETS: 316 10*3/uL (ref 150–400)
RBC: 4.52 MIL/uL (ref 3.87–5.11)
RDW: 12.6 % (ref 11.5–15.5)
WBC: 9.3 10*3/uL (ref 4.0–10.5)

## 2016-08-21 LAB — BASIC METABOLIC PANEL
ANION GAP: 5 (ref 5–15)
BUN: 19 mg/dL (ref 6–20)
CO2: 28 mmol/L (ref 22–32)
Calcium: 9.6 mg/dL (ref 8.9–10.3)
Chloride: 104 mmol/L (ref 101–111)
Creatinine, Ser: 0.95 mg/dL (ref 0.44–1.00)
GLUCOSE: 104 mg/dL — AB (ref 65–99)
POTASSIUM: 3.9 mmol/L (ref 3.5–5.1)
SODIUM: 137 mmol/L (ref 135–145)

## 2016-08-22 MED ORDER — LIDOCAINE HCL 3.5 % OP GEL: 1.0000 "application " | Freq: Once | OPHTHALMIC | Status: DC

## 2016-08-25 ENCOUNTER — Encounter (HOSPITAL_COMMUNITY)
Admission: RE | Disposition: A | Discharge status: Home / Self Care | Payer: Self-pay | Source: Ambulatory Visit | Attending: Ophthalmology

## 2016-08-25 ENCOUNTER — Ambulatory Visit (HOSPITAL_COMMUNITY): Payer: BLUE CROSS/BLUE SHIELD | Admitting: Anesthesiology

## 2016-08-25 ENCOUNTER — Encounter (HOSPITAL_COMMUNITY): Payer: Self-pay | Admitting: *Deleted

## 2016-08-25 ENCOUNTER — Ambulatory Visit (HOSPITAL_COMMUNITY)
Admission: RE | Admit: 2016-08-25 | Discharge: 2016-08-25 | Disposition: A | Discharge status: Home / Self Care | Payer: BLUE CROSS/BLUE SHIELD | Source: Ambulatory Visit | Attending: Ophthalmology | Admitting: Ophthalmology

## 2016-08-25 DIAGNOSIS — E039 Hypothyroidism, unspecified: Secondary | ICD-10-CM | POA: Insufficient documentation

## 2016-08-25 DIAGNOSIS — J45909 Unspecified asthma, uncomplicated: Secondary | ICD-10-CM | POA: Diagnosis not present

## 2016-08-25 DIAGNOSIS — H52202 Unspecified astigmatism, left eye: Secondary | ICD-10-CM | POA: Diagnosis not present

## 2016-08-25 DIAGNOSIS — H2512 Age-related nuclear cataract, left eye: Secondary | ICD-10-CM | POA: Diagnosis present

## 2016-08-25 DIAGNOSIS — G4730 Sleep apnea, unspecified: Secondary | ICD-10-CM | POA: Diagnosis not present

## 2016-08-25 DIAGNOSIS — M1990 Unspecified osteoarthritis, unspecified site: Secondary | ICD-10-CM | POA: Insufficient documentation

## 2016-08-25 HISTORY — PX: CATARACT EXTRACTION W/PHACO: SHX586

## 2016-08-25 SURGERY — PHACOEMULSIFICATION, CATARACT, WITH IOL INSERTION
Anesthesia: Monitor Anesthesia Care | Site: Eye | Laterality: Left

## 2016-08-25 MED ORDER — FENTANYL CITRATE (PF) 100 MCG/2ML IJ SOLN
INTRAMUSCULAR | Status: AC
  Filled 2016-08-25: qty 2

## 2016-08-25 MED ORDER — LACTATED RINGERS IV SOLN
INTRAVENOUS | Status: DC
  Administered 2016-08-25: 11:00:00 via INTRAVENOUS

## 2016-08-25 MED ORDER — EPINEPHRINE PF 1 MG/ML IJ SOLN
INTRAOCULAR | Status: DC | PRN
  Administered 2016-08-25: 500 mL

## 2016-08-25 MED ORDER — LIDOCAINE 3.5 % OP GEL OPTIME - NO CHARGE
OPHTHALMIC | Status: DC | PRN
  Administered 2016-08-25: 2 [drp] via OPHTHALMIC

## 2016-08-25 MED ORDER — PHENYLEPHRINE HCL 2.5 % OP SOLN
1.0000 [drp] | OPHTHALMIC | Status: AC
  Administered 2016-08-25 (×3): 1 [drp] via OPHTHALMIC

## 2016-08-25 MED ORDER — EPINEPHRINE PF 1 MG/ML IJ SOLN
INTRAMUSCULAR | Status: AC
  Filled 2016-08-25: qty 1

## 2016-08-25 MED ORDER — PROVISC 10 MG/ML IO SOLN
INTRAOCULAR | Status: DC | PRN
  Administered 2016-08-25: 0.85 mL via INTRAOCULAR

## 2016-08-25 MED ORDER — MIDAZOLAM HCL 2 MG/2ML IJ SOLN
INTRAMUSCULAR | Status: AC
  Filled 2016-08-25: qty 2

## 2016-08-25 MED ORDER — FENTANYL CITRATE (PF) 100 MCG/2ML IJ SOLN
25.0000 ug | INTRAMUSCULAR | Status: AC | PRN
  Administered 2016-08-25 (×2): 25 ug via INTRAVENOUS

## 2016-08-25 MED ORDER — MIDAZOLAM HCL 5 MG/5ML IJ SOLN
INTRAMUSCULAR | Status: DC | PRN
  Administered 2016-08-25: 1 mg via INTRAVENOUS

## 2016-08-25 MED ORDER — LIDOCAINE HCL 3.5 % OP GEL
OPHTHALMIC | Status: AC
  Filled 2016-08-25: qty 1

## 2016-08-25 MED ORDER — TETRACAINE HCL 0.5 % OP SOLN
1.0000 [drp] | OPHTHALMIC | Status: AC
  Administered 2016-08-25 (×3): 1 [drp] via OPHTHALMIC

## 2016-08-25 MED ORDER — LIDOCAINE HCL (PF) 1 % IJ SOLN
INTRAMUSCULAR | Status: DC | PRN
  Administered 2016-08-25: .6 mL

## 2016-08-25 MED ORDER — NEOMYCIN-POLYMYXIN-DEXAMETH 3.5-10000-0.1 OP SUSP
OPHTHALMIC | Status: DC | PRN
  Administered 2016-08-25: 2 [drp] via OPHTHALMIC

## 2016-08-25 MED ORDER — CYCLOPENTOLATE-PHENYLEPHRINE 0.2-1 % OP SOLN
1.0000 [drp] | OPHTHALMIC | Status: AC
  Administered 2016-08-25 (×3): 1 [drp] via OPHTHALMIC

## 2016-08-25 MED ORDER — MIDAZOLAM HCL 2 MG/2ML IJ SOLN
1.0000 mg | INTRAMUSCULAR | Status: DC | PRN
  Administered 2016-08-25 (×2): 1 mg via INTRAVENOUS

## 2016-08-25 MED ORDER — POVIDONE-IODINE 5 % OP SOLN
OPHTHALMIC | Status: DC | PRN
  Administered 2016-08-25: 1 via OPHTHALMIC

## 2016-08-25 MED ORDER — BSS IO SOLN
INTRAOCULAR | Status: DC | PRN
  Administered 2016-08-25: 15 mL

## 2016-08-25 SURGICAL SUPPLY — 14 items
CLOTH BEACON ORANGE TIMEOUT ST (SAFETY) ×2 IMPLANT
EYE SHIELD UNIVERSAL CLEAR (GAUZE/BANDAGES/DRESSINGS) ×2 IMPLANT
GLOVE BIOGEL PI IND STRL 6.5 (GLOVE) ×2 IMPLANT
GLOVE BIOGEL PI INDICATOR 6.5 (GLOVE) ×2
GLOVE EXAM NITRILE MD LF STRL (GLOVE) ×2 IMPLANT
GOWN STRL REUS W/TWL LRG LVL3 (GOWN DISPOSABLE) ×2 IMPLANT
LENS IOL ACRYSOF IQ TORIC 15.0 ×1 IMPLANT
PAD ARMBOARD 7.5X6 YLW CONV (MISCELLANEOUS) ×2 IMPLANT
PROC W SPEC LENS (INTRAOCULAR LENS) ×2
PROCESS W SPEC LENS (INTRAOCULAR LENS) IMPLANT
SYRINGE LUER LOK 1CC (MISCELLANEOUS) ×2 IMPLANT
TAPE SURG TRANSPORE 1 IN (GAUZE/BANDAGES/DRESSINGS) ×1 IMPLANT
TAPE SURGICAL TRANSPORE 1 IN (GAUZE/BANDAGES/DRESSINGS) ×1
WATER STERILE IRR 250ML POUR (IV SOLUTION) ×2 IMPLANT

## 2016-08-25 NOTE — Transfer of Care (Signed)
Immediate Anesthesia Transfer of Care Note  Patient: Kathleen Wall  Procedure(s) Performed: Procedure(s) with comments: CATARACT EXTRACTION PHACO AND INTRAOCULAR LENS PLACEMENT LEFT EYE CDE=10.19 (Left) - left  Patient Location: PACU  Anesthesia Type:MAC  Level of Consciousness: awake, alert , oriented, patient cooperative and responds to stimulation  Airway & Oxygen Therapy: Patient Spontanous Breathing and Patient connected to nasal cannula oxygen  Post-op Assessment: Report given to RN and Patient moving all extremities X 4  Post vital signs: Reviewed and stable  Last Vitals:  Vitals:   08/25/16 1110 08/25/16 1115  BP: 127/74 124/75  Pulse:    Resp: 16 (!) 21  Temp:      Last Pain:  Vitals:   08/25/16 1028  TempSrc: Oral  PainSc: 3       Patients Stated Pain Goal: 7 ([id number])  Complications: No apparent anesthesia complications

## 2016-08-25 NOTE — Discharge Instructions (Signed)

## 2016-08-25 NOTE — Anesthesia Postprocedure Evaluation (Signed)
Anesthesia Post Note  Patient: Kathleen Wall  Procedure(s) Performed: Procedure(s) (LRB): CATARACT EXTRACTION PHACO AND INTRAOCULAR LENS PLACEMENT LEFT EYE CDE=10.19 (Left)  Patient location during evaluation: PACU Anesthesia Type: MAC Level of consciousness: awake Pain management: satisfactory to patient Vital Signs Assessment: post-procedure vital signs reviewed and stable Respiratory status: spontaneous breathing Cardiovascular status: stable Anesthetic complications: no    Last Vitals:  Vitals:   08/25/16 1110 08/25/16 1115  BP: 127/74 124/75  Pulse:    Resp: 16 (!) 21  Temp:      Last Pain:  Vitals:   08/25/16 1028  TempSrc: Oral  PainSc: 3                  Shadie Sweatman

## 2016-08-25 NOTE — Op Note (Signed)
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  Assistants: None  Anesthesia: Topical with MAC  Indications: Painless, progressive loss of vision with compromise of daily activities.  Surgery: Cataract Extraction with Intraocular lens Implant Left Eye  Discription: The patient had dilating drops and viscous lidocaine placed into the Left in the pre-op holding area. In the sitting position horizontal reference marks were made on the cornea.  After transfer to the operating room, a time out was performed. The patient was then prepped and draped. Beginning with a 24 degree blade a paracentesis port was made at the surgeon's 2 o'clock position. The anterior chamber was then filled with 1% non-preserved lidocaine. This was followed by filling the anterior chamber with Provisc. A 2.32mm keratome blade was used to make a clear cornea incision at the temporal limbus. A bent cystatome needle was used to create a continuous tear capsulotomy. Hydrodissection was performed with balanced salt solution on a Fine canula. The lens nucleus was then removed using the phacoemulsification handpiece. Residual cortex was removed with the I&A handpiece. The anterior chamber and capsular bag were refilled with Provisc. A posterior chamber intraocular lens was placed into the capsular bag with it's injector. Additional corneal marks were made on the 100/280 degree meridians.  The Provisc was then removed from the anterior chamber and capsular bag with the I&A handpiece. The implant was positioned with the Kuglan hook. Stromal hydration of the main incision and paracentesis port was performed with BSS on a Fine canula. The wounds were tested for leak which was negative. The patient tolerated the procedure well. There were no operative complications. The  patient was then transferred to the recovery room in stable condition.  Complications: None  Specimen: None  EBL: None  Prosthetic device: Alcon AcrySof Toric [id number], power15.0,  SN [id number].

## 2016-08-25 NOTE — H&P (Signed)
I have reviewed the H&P, the patient was re-examined, and I have identified no interval changes in medical condition and plan of care since the history and physical of record  

## 2016-08-25 NOTE — Anesthesia Procedure Notes (Signed)
Procedure Name: MAC Date/Time: 08/25/2016 11:27 AM Performed by: Michele Rockers Pre-anesthesia Checklist: Patient identified, Emergency Drugs available, Suction available, Timeout performed and Patient being monitored Patient Re-evaluated:Patient Re-evaluated prior to inductionOxygen Delivery Method: Nasal Cannula

## 2016-08-25 NOTE — Anesthesia Preprocedure Evaluation (Signed)
Anesthesia Evaluation  Patient identified by MRN, date of birth, ID band Patient awake    Reviewed: Allergy & Precautions, NPO status , Patient's Chart, lab work & pertinent test results  Airway Mallampati: I  TM Distance: >3 FB    Comment: Hx TMJ dysfunction Dental  (+) Teeth Intact   Pulmonary asthma , sleep apnea ,    breath sounds clear to auscultation       Cardiovascular negative cardio ROS   Rhythm:Regular Rate:Normal     Neuro/Psych    GI/Hepatic negative GI ROS,   Endo/Other  Hypothyroidism   Renal/GU      Musculoskeletal  (+) Arthritis ,   Abdominal   Peds  Hematology   Anesthesia Other Findings   Reproductive/Obstetrics                             Anesthesia Physical Anesthesia Plan  ASA: II  Anesthesia Plan: MAC   Post-op Pain Management:    Induction: Intravenous  Airway Management Planned: Nasal Cannula  Additional Equipment:   Intra-op Plan:   Post-operative Plan:   Informed Consent: I have reviewed the patients History and Physical, chart, labs and discussed the procedure including the risks, benefits and alternatives for the proposed anesthesia with the patient or authorized representative who has indicated his/her understanding and acceptance.     Plan Discussed with:   Anesthesia Plan Comments:         Anesthesia Quick Evaluation

## 2016-08-26 ENCOUNTER — Encounter (HOSPITAL_COMMUNITY): Payer: Self-pay | Admitting: Ophthalmology

## 2016-09-16 ENCOUNTER — Encounter (HOSPITAL_COMMUNITY): Payer: Self-pay

## 2016-09-16 NOTE — Patient Instructions (Signed)
Kathleen Wall  09/16/2016     @PREFPERIOPPHARMACY @   Your procedure is scheduled on 09/22/2016.  Report to [facility] at 7:30 A.M.  Call this number if you have problems the morning of surgery:  [phone]   Remember:  Do not eat food or drink liquids after midnight.  Take these medicines the morning of surgery with A SIP OF WATER Albuterol inhaler and bring with you to the hospital, lodine, synthroid   Do not wear jewelry, make-up or nail polish.  Do not wear lotions, powders, or perfumes, or deoderant.  Do not shave 48 hours prior to surgery.  Men may shave face and neck.  Do not bring valuables to the hospital.  [facility] is not responsible for any belongings or valuables.  Contacts, dentures or bridgework may not be worn into surgery.  Leave your suitcase in the car.  After surgery it may be brought to your room.  For patients admitted to the hospital, discharge time will be determined by your treatment team.  Patients discharged the day of surgery will not be allowed to drive home.    Please read over the following fact sheets that you were given. Anesthesia Post-op Instructions     PATIENT INSTRUCTIONS POST-ANESTHESIA  IMMEDIATELY FOLLOWING SURGERY:  Do not drive or operate machinery for the first twenty four hours after surgery.  Do not make any important decisions for twenty four hours after surgery or while taking narcotic pain medications or sedatives.  If you develop intractable nausea and vomiting or a severe headache please notify your doctor immediately.  FOLLOW-UP:  Please make an appointment with your surgeon as instructed. You do not need to follow up with anesthesia unless specifically instructed to do so.  WOUND CARE INSTRUCTIONS (if applicable):  Keep a dry clean dressing on the anesthesia/puncture wound site if there is drainage.  Once the wound has quit draining you may leave it open to air.  Generally you should leave the bandage intact for twenty  four hours unless there is drainage.  If the epidural site drains for more than 36-48 hours please call the anesthesia department.  QUESTIONS?:  Please feel free to call your physician or the hospital operator if you have any questions, and they will be happy to assist you.      Cataract A cataract is cloudiness on the lens of your eye. The lens is the clear part of your eye that is behind your iris and pupil. The lens focuses light on the retina, which lets you see clearly. When a lens becomes cloudy, vision may become blurry. The clouding can range from a tiny dot to complete cloudiness. As some cataracts develop, they make a person more nearsighted. Other cataracts increase glare. Cataracts can worsen over time, and sometimes the pupil can look white. Cataracts get bigger and they cloud more of the lens, making it difficult to see. Cataracts can affect one eye or both eyes. What are the causes? Most cataracts are associated with age-related eye changes. The eye lens is mostly made up of water and protein. Normally, this protein is arranged in a way that keeps the lens clear. Cataracts develop when protein begins to clump together over time. This clouds the lens and lets less light pass through to the retina, which causes blurry vision. What increases the risk? This condition is more likely to develop in people who:  Are 37 years of age or older.  Have diabetes.  Have high blood  pressure.  Takecertain medicines, such as steroids or hormone replacement therapy.  Have had an eye injury.  Have or have had eye inflammation.  Have a family history of cataracts.  Smoke.  Drink alcohol heavily.  Are frequently exposed to sun or very strong light without eye protection.  Are obese.  Have been exposed to large amounts of radiation, lead, or other toxic substances.  Have had eye surgery. What are the signs or symptoms? The main symptom of a cataract is blurry vision. Your vision may  change or get worse over time. Other symptoms include:  Increased glare.  Seeing a bright ring or halo around light.  Poor night vision.  Double vision in one eye.  Having trouble seeing, even while wearing contact lenses or glasses.  Seeing colors that appear faded.  Trouble telling the difference between blue and purple.  Needing frequent changes to your prescription glasses or contacts. How is this diagnosed? This condition is diagnosed with a medical history and eye exam. You may need to see an eye specialist (optometrist or ophthalmologist). Your health care provider may enlarge (dilate) your pupils with eye drops to see the back of your eye more clearly and look for signs of cataracts or other damage. You may also have tests, including:  A visual acuity test. This uses a chart to determine the smallest letters that you can see from a specific distance.  A slit-lamp exam. This uses a microscope to examine small sections of your eye for abnormalities.  Tonometry. This test measures the pressure of the fluid inside your eye. How is this treated? Treatment depends on the stage of your cataract. For an early cataract, vision may improve by using different eyeglasses or stronger lighting. If that does not help your vision, surgery may be recommended to remove the cataract. If your health care provider thinks your cataract may be linked to any medicines that you are taking, he or she may change your medicines. Follow these instructions at home: Lifestyle  Use stronger or brighter lighting.  Consider using a magnifying glass for reading or other activities.  Become familiar with your surroundings. Having poor vision can put you at a greater risk for tripping, falling, or bumping into things.  Wear sunglasses and a hat if you are sensitive to bright light or are having problems with glare.  Quit smoking if you smoke. If you need help quitting, talk with your health care  provider. General instructions  If you are prescribed new eyeglasses, wear them as told by your health care provider.  Take over-the-counter and prescription medicines only as told by your health care provider. Do not change your medicines unless told by your health care provider.  Do not drive or operate heavy machinery if your vision is blurry, particularly at night.  Keep your blood sugar under control, if you have diabetes.  Keep all follow-up visits as told by your health care provider. This is important. Contact a health care provider if:  Your symptoms get worse.  Your vision affects your ability to perform daily activities.  You have new symptoms.  You have a fever. Get help right away if:  You have sudden vision loss.  You have redness, swelling, or increasing pain in your eye.  You develop a headache and sensitivity to light. This information is not intended to replace advice given to you by your health care provider. Make sure you discuss any questions you have with your health care provider. Document Released:  09/29/2005 Document Revised: 02/07/2016 Document Reviewed: 04/04/2015 Elsevier Interactive Patient Education  2017 [employer].

## 2016-09-17 ENCOUNTER — Encounter (HOSPITAL_COMMUNITY)
Admission: RE | Admit: 2016-09-17 | Discharge: 2016-09-17 | Disposition: A | Discharge status: Home / Self Care | Payer: BLUE CROSS/BLUE SHIELD | Source: Ambulatory Visit | Attending: Ophthalmology | Admitting: Ophthalmology

## 2016-09-19 MED ORDER — TETRACAINE HCL 0.5 % OP SOLN
OPHTHALMIC | Status: AC
  Filled 2016-09-19: qty 4

## 2016-09-19 MED ORDER — PHENYLEPHRINE HCL 2.5 % OP SOLN
OPHTHALMIC | Status: AC
  Filled 2016-09-19: qty 15

## 2016-09-19 MED ORDER — LIDOCAINE HCL 3.5 % OP GEL
OPHTHALMIC | Status: AC
  Filled 2016-09-19: qty 1

## 2016-09-19 MED ORDER — LIDOCAINE HCL (PF) 1 % IJ SOLN
INTRAMUSCULAR | Status: AC
  Filled 2016-09-19: qty 2

## 2016-09-19 MED ORDER — CYCLOPENTOLATE-PHENYLEPHRINE 0.2-1 % OP SOLN
OPHTHALMIC | Status: AC
Start: 2016-09-19 — End: 2016-09-19
  Filled 2016-09-19: qty 2

## 2016-09-19 MED ORDER — NEOMYCIN-POLYMYXIN-DEXAMETH 3.5-10000-0.1 OP SUSP
OPHTHALMIC | Status: AC
  Filled 2016-09-19: qty 5

## 2016-09-22 ENCOUNTER — Encounter (HOSPITAL_COMMUNITY): Payer: Self-pay | Admitting: *Deleted

## 2016-09-22 ENCOUNTER — Encounter (HOSPITAL_COMMUNITY)
Admission: RE | Disposition: A | Discharge status: Home / Self Care | Payer: Self-pay | Source: Ambulatory Visit | Attending: Ophthalmology

## 2016-09-22 ENCOUNTER — Ambulatory Visit (HOSPITAL_COMMUNITY): Payer: BLUE CROSS/BLUE SHIELD | Admitting: Anesthesiology

## 2016-09-22 ENCOUNTER — Ambulatory Visit (HOSPITAL_COMMUNITY)
Admission: RE | Admit: 2016-09-22 | Discharge: 2016-09-22 | Disposition: A | Discharge status: Home / Self Care | Payer: BLUE CROSS/BLUE SHIELD | Source: Ambulatory Visit | Attending: Ophthalmology | Admitting: Ophthalmology

## 2016-09-22 DIAGNOSIS — M1990 Unspecified osteoarthritis, unspecified site: Secondary | ICD-10-CM | POA: Diagnosis not present

## 2016-09-22 DIAGNOSIS — G4730 Sleep apnea, unspecified: Secondary | ICD-10-CM | POA: Insufficient documentation

## 2016-09-22 DIAGNOSIS — H52201 Unspecified astigmatism, right eye: Secondary | ICD-10-CM | POA: Insufficient documentation

## 2016-09-22 DIAGNOSIS — H2511 Age-related nuclear cataract, right eye: Secondary | ICD-10-CM | POA: Diagnosis not present

## 2016-09-22 DIAGNOSIS — E039 Hypothyroidism, unspecified: Secondary | ICD-10-CM | POA: Insufficient documentation

## 2016-09-22 DIAGNOSIS — J45909 Unspecified asthma, uncomplicated: Secondary | ICD-10-CM | POA: Insufficient documentation

## 2016-09-22 HISTORY — PX: CATARACT EXTRACTION W/PHACO: SHX586

## 2016-09-22 SURGERY — PHACOEMULSIFICATION, CATARACT, WITH IOL INSERTION
Anesthesia: Monitor Anesthesia Care | Site: Eye | Laterality: Right

## 2016-09-22 MED ORDER — TETRACAINE HCL 0.5 % OP SOLN
1.0000 [drp] | OPHTHALMIC | Status: AC
  Administered 2016-09-22 (×3): 1 [drp] via OPHTHALMIC

## 2016-09-22 MED ORDER — ONDANSETRON HCL 4 MG/2ML IJ SOLN
4.0000 mg | Freq: Once | INTRAMUSCULAR | Status: AC
  Administered 2016-09-22: 4 mg via INTRAVENOUS

## 2016-09-22 MED ORDER — GLYCOPYRROLATE 0.2 MG/ML IJ SOLN
0.2000 mg | Freq: Once | INTRAMUSCULAR | Status: AC
  Administered 2016-09-22: 0.2 mg via INTRAVENOUS

## 2016-09-22 MED ORDER — PROVISC 10 MG/ML IO SOLN
INTRAOCULAR | Status: DC | PRN
  Administered 2016-09-22: 0.85 mL via INTRAOCULAR

## 2016-09-22 MED ORDER — FENTANYL CITRATE (PF) 100 MCG/2ML IJ SOLN
25.0000 ug | INTRAMUSCULAR | Status: AC | PRN
  Administered 2016-09-22 (×2): 25 ug via INTRAVENOUS
  Filled 2016-09-22: qty 2

## 2016-09-22 MED ORDER — LACTATED RINGERS IV SOLN
INTRAVENOUS | Status: DC
  Administered 2016-09-22: 08:00:00 via INTRAVENOUS

## 2016-09-22 MED ORDER — EPINEPHRINE PF 1 MG/ML IJ SOLN
INTRAMUSCULAR | Status: AC
  Filled 2016-09-22: qty 1

## 2016-09-22 MED ORDER — LIDOCAINE HCL (PF) 1 % IJ SOLN
INTRAMUSCULAR | Status: DC | PRN
  Administered 2016-09-22: .8 mL

## 2016-09-22 MED ORDER — EPINEPHRINE PF 1 MG/ML IJ SOLN
INTRAOCULAR | Status: DC | PRN
  Administered 2016-09-22: 500 mL

## 2016-09-22 MED ORDER — MIDAZOLAM HCL 2 MG/2ML IJ SOLN
1.0000 mg | INTRAMUSCULAR | Status: DC | PRN
  Administered 2016-09-22: 2 mg via INTRAVENOUS
  Filled 2016-09-22: qty 2

## 2016-09-22 MED ORDER — MIDAZOLAM HCL 2 MG/2ML IJ SOLN
INTRAMUSCULAR | Status: AC
  Filled 2016-09-22: qty 2

## 2016-09-22 MED ORDER — GLYCOPYRROLATE 0.2 MG/ML IJ SOLN: 0.1000 mg | Freq: Once | INTRAMUSCULAR | Status: DC

## 2016-09-22 MED ORDER — GLYCOPYRROLATE 0.2 MG/ML IJ SOLN
INTRAMUSCULAR | Status: AC
  Filled 2016-09-22: qty 1

## 2016-09-22 MED ORDER — PHENYLEPHRINE HCL 2.5 % OP SOLN
1.0000 [drp] | OPHTHALMIC | Status: AC
  Administered 2016-09-22 (×3): 1 [drp] via OPHTHALMIC

## 2016-09-22 MED ORDER — BSS IO SOLN
INTRAOCULAR | Status: DC | PRN
  Administered 2016-09-22: 15 mL

## 2016-09-22 MED ORDER — ONDANSETRON HCL 4 MG/2ML IJ SOLN
INTRAMUSCULAR | Status: AC
  Filled 2016-09-22: qty 2

## 2016-09-22 MED ORDER — NEOMYCIN-POLYMYXIN-DEXAMETH 3.5-10000-0.1 OP SUSP
OPHTHALMIC | Status: DC | PRN
  Administered 2016-09-22: 2 [drp] via OPHTHALMIC

## 2016-09-22 MED ORDER — MIDAZOLAM HCL 5 MG/5ML IJ SOLN
INTRAMUSCULAR | Status: DC | PRN
  Administered 2016-09-22: 2 mg via INTRAVENOUS

## 2016-09-22 MED ORDER — CYCLOPENTOLATE-PHENYLEPHRINE 0.2-1 % OP SOLN
1.0000 [drp] | OPHTHALMIC | Status: AC
  Administered 2016-09-22 (×3): 1 [drp] via OPHTHALMIC

## 2016-09-22 MED ORDER — POVIDONE-IODINE 5 % OP SOLN
OPHTHALMIC | Status: DC | PRN
  Administered 2016-09-22: 1 via OPHTHALMIC

## 2016-09-22 MED ORDER — LIDOCAINE HCL 3.5 % OP GEL
1.0000 "application " | Freq: Once | OPHTHALMIC | Status: AC
  Administered 2016-09-22: 1 via OPHTHALMIC
  Filled 2016-09-22: qty 1

## 2016-09-22 SURGICAL SUPPLY — 16 items
CLOTH BEACON ORANGE TIMEOUT ST (SAFETY) ×2 IMPLANT
EYE SHIELD UNIVERSAL CLEAR (GAUZE/BANDAGES/DRESSINGS) ×2 IMPLANT
GLOVE BIOGEL PI IND STRL 6.5 (GLOVE) ×2 IMPLANT
GLOVE BIOGEL PI IND STRL 8 (GLOVE) ×1 IMPLANT
GLOVE BIOGEL PI INDICATOR 6.5 (GLOVE) ×2
GLOVE BIOGEL PI INDICATOR 8 (GLOVE) ×1
GOWN STRL REUS W/ TWL LRG LVL3 (GOWN DISPOSABLE) ×1 IMPLANT
GOWN STRL REUS W/TWL LRG LVL3 (GOWN DISPOSABLE) ×2
LENS IOL ACRYSOF IQ TORIC 15.0 ×2 IMPLANT
PAD ARMBOARD 7.5X6 YLW CONV (MISCELLANEOUS) ×2 IMPLANT
PROC W SPEC LENS (INTRAOCULAR LENS) ×2
PROCESS W SPEC LENS (INTRAOCULAR LENS) ×1 IMPLANT
SYRINGE LUER LOK 1CC (MISCELLANEOUS) ×1 IMPLANT
TAPE SURG TRANSPORE 1 IN (GAUZE/BANDAGES/DRESSINGS) ×1 IMPLANT
TAPE SURGICAL TRANSPORE 1 IN (GAUZE/BANDAGES/DRESSINGS) ×1
WATER STERILE IRR 250ML POUR (IV SOLUTION) ×2 IMPLANT

## 2016-09-22 NOTE — Transfer of Care (Signed)
Immediate Anesthesia Transfer of Care Note  Patient: Kathleen Wall  Procedure(s) Performed: Procedure(s): CATARACT EXTRACTION PHACO AND INTRAOCULAR LENS PLACEMENT RIGHT EYE CDE=9.79 (Right)  Patient Location: Short Stay  Anesthesia Type:MAC  Level of Consciousness: awake, alert , oriented and patient cooperative  Airway & Oxygen Therapy: Patient Spontanous Breathing  Post-op Assessment: Report given to RN, Post -op Vital signs reviewed and stable and Patient moving all extremities  Post vital signs: Reviewed and stable  Last Vitals:  Vitals:   09/22/16 0930 09/22/16 0935  BP: 126/71 120/75  Pulse:    Resp: 20 10  Temp:      Last Pain:  Vitals:   09/22/16 0813  TempSrc: Oral      Patients Stated Pain Goal: 7 ([id number])  Complications: No apparent anesthesia complications

## 2016-09-22 NOTE — Anesthesia Preprocedure Evaluation (Signed)
Anesthesia Evaluation  Patient identified by MRN, date of birth, ID band Patient awake    Reviewed: Allergy & Precautions, NPO status , Patient's Chart, lab work & pertinent test results  Airway Mallampati: I  TM Distance: >3 FB    Comment: Hx TMJ dysfunction Dental  (+) Teeth Intact   Pulmonary asthma , sleep apnea ,    breath sounds clear to auscultation       Cardiovascular negative cardio ROS   Rhythm:Regular Rate:Normal     Neuro/Psych    GI/Hepatic negative GI ROS,   Endo/Other  Hypothyroidism   Renal/GU      Musculoskeletal  (+) Arthritis ,   Abdominal   Peds  Hematology   Anesthesia Other Findings   Reproductive/Obstetrics                             Anesthesia Physical Anesthesia Plan  ASA: II  Anesthesia Plan: MAC   Post-op Pain Management:    Induction: Intravenous  Airway Management Planned: Nasal Cannula  Additional Equipment:   Intra-op Plan:   Post-operative Plan:   Informed Consent: I have reviewed the patients History and Physical, chart, labs and discussed the procedure including the risks, benefits and alternatives for the proposed anesthesia with the patient or authorized representative who has indicated his/her understanding and acceptance.     Plan Discussed with:   Anesthesia Plan Comments:         Anesthesia Quick Evaluation

## 2016-09-22 NOTE — Op Note (Signed)
Date of Admission: 09/22/2016  Date of Surgery: 09/22/2016  Pre-Op Dx: Cataract Right Eye  Post-Op Dx: Senile Nuclear Cataract Right Eye,  Dx Code H25.11, Astigmatism Right Eye, Dx Code H52.2  Surgeon: Tonny Branch, M.D.  Assistants: None  Anesthesia: Topical with MAC  Indications: Painless, progressive loss of vision with compromise of daily activities.  Surgery: Cataract Extraction with Intraocular lens Implant Right Eye  Discription: The patient had dilating drops and viscous lidocaine placed into the Right in the pre-op holding area. In the sitting position horizontal reference marks were made on the cornea.  After transfer to the operating room, a time out was performed. The patient was then prepped and draped. Beginning with a 44 degree blade a paracentesis port was made at the surgeon's 2 o'clock position. The anterior chamber was then filled with 1% non-preserved lidocaine. This was followed by filling the anterior chamber with Provisc. A 2.1mm keratome blade was used to make a clear cornea incision at the temporal limbus. A bent cystatome needle was used to create a continuous tear capsulotomy. Hydrodissection was performed with balanced salt solution on a Fine canula. The lens nucleus was then removed using the phacoemulsification handpiece. Residual cortex was removed with the I&A handpiece. The anterior chamber and capsular bag were refilled with Provisc. A posterior chamber intraocular lens was placed into the capsular bag with it's injector. Additional corneal marks were made on the 075/255 degree meridians.  The Provisc was then removed from the anterior chamber and capsular bag with the I&A handpiece. The implant was positioned with the Kuglan hook. Stromal hydration of the main incision and paracentesis port was performed with BSS on a Fine canula. The wounds were tested for leak which was negative. The patient tolerated the procedure well. There were no operative complications. The  patient was then transferred to the recovery room in stable condition.  Complications: None  Specimen: None  EBL: None  Prosthetic device: Alcon AcrySof Toric [id number], power 18.0,  SN [id number].

## 2016-09-22 NOTE — H&P (Signed)
I have reviewed the H&P, the patient was re-examined, and I have identified no interval changes in medical condition and plan of care since the history and physical of record  

## 2016-09-22 NOTE — Anesthesia Postprocedure Evaluation (Signed)
Anesthesia Post Note  Patient: Kathleen Wall  Procedure(s) Performed: Procedure(s) (LRB): CATARACT EXTRACTION PHACO AND INTRAOCULAR LENS PLACEMENT RIGHT EYE CDE=9.79 (Right)  Patient location during evaluation: Short Stay Anesthesia Type: MAC Level of consciousness: awake and alert, oriented and patient cooperative Pain management: pain level controlled Vital Signs Assessment: post-procedure vital signs reviewed and stable Respiratory status: spontaneous breathing, nonlabored ventilation and respiratory function stable Cardiovascular status: blood pressure returned to baseline Postop Assessment: no signs of nausea or vomiting and adequate PO intake Anesthetic complications: no    Last Vitals:  Vitals:   09/22/16 0930 09/22/16 0935  BP: 126/71 120/75  Pulse:    Resp: 20 10  Temp:      Last Pain:  Vitals:   09/22/16 0813  TempSrc: Oral                 Annastasia Haskins J

## 2016-09-25 ENCOUNTER — Encounter (HOSPITAL_COMMUNITY): Payer: Self-pay | Admitting: Ophthalmology

## 2017-03-17 ENCOUNTER — Other Ambulatory Visit: Payer: Self-pay | Admitting: Obstetrics and Gynecology

## 2017-03-17 DIAGNOSIS — Z1231 Encounter for screening mammogram for malignant neoplasm of breast: Secondary | ICD-10-CM

## 2017-04-09 ENCOUNTER — Ambulatory Visit: Payer: BLUE CROSS/BLUE SHIELD

## 2017-04-09 ENCOUNTER — Ambulatory Visit
Admission: RE | Admit: 2017-04-09 | Discharge: 2017-04-09 | Disposition: A | Discharge status: Home / Self Care | Payer: BLUE CROSS/BLUE SHIELD | Source: Ambulatory Visit | Attending: Obstetrics and Gynecology | Admitting: Obstetrics and Gynecology

## 2017-04-09 DIAGNOSIS — Z1231 Encounter for screening mammogram for malignant neoplasm of breast: Secondary | ICD-10-CM

## 2017-05-04 ENCOUNTER — Ambulatory Visit: Payer: BLUE CROSS/BLUE SHIELD

## 2018-04-12 DIAGNOSIS — Z1231 Encounter for screening mammogram for malignant neoplasm of breast: Secondary | ICD-10-CM | POA: Diagnosis not present

## 2018-04-12 DIAGNOSIS — Z01419 Encounter for gynecological examination (general) (routine) without abnormal findings: Secondary | ICD-10-CM | POA: Diagnosis not present

## 2018-07-30 DIAGNOSIS — E039 Hypothyroidism, unspecified: Secondary | ICD-10-CM | POA: Diagnosis not present

## 2018-07-30 DIAGNOSIS — D519 Vitamin B12 deficiency anemia, unspecified: Secondary | ICD-10-CM | POA: Diagnosis not present

## 2018-07-30 DIAGNOSIS — K219 Gastro-esophageal reflux disease without esophagitis: Secondary | ICD-10-CM | POA: Diagnosis not present

## 2018-08-03 DIAGNOSIS — M545 Low back pain: Secondary | ICD-10-CM | POA: Diagnosis not present

## 2018-08-03 DIAGNOSIS — E7800 Pure hypercholesterolemia, unspecified: Secondary | ICD-10-CM | POA: Diagnosis not present

## 2018-08-03 DIAGNOSIS — Z23 Encounter for immunization: Secondary | ICD-10-CM | POA: Diagnosis not present

## 2018-08-03 DIAGNOSIS — K219 Gastro-esophageal reflux disease without esophagitis: Secondary | ICD-10-CM | POA: Diagnosis not present

## 2018-08-03 DIAGNOSIS — E039 Hypothyroidism, unspecified: Secondary | ICD-10-CM | POA: Diagnosis not present

## 2018-08-03 DIAGNOSIS — Z6825 Body mass index (BMI) 25.0-25.9, adult: Secondary | ICD-10-CM | POA: Diagnosis not present

## 2018-08-03 DIAGNOSIS — Z0000 Encounter for general adult medical examination without abnormal findings: Secondary | ICD-10-CM | POA: Diagnosis not present

## 2018-08-27 DIAGNOSIS — Z6825 Body mass index (BMI) 25.0-25.9, adult: Secondary | ICD-10-CM | POA: Diagnosis not present

## 2018-08-27 DIAGNOSIS — G4733 Obstructive sleep apnea (adult) (pediatric): Secondary | ICD-10-CM | POA: Diagnosis not present

## 2018-08-27 DIAGNOSIS — J019 Acute sinusitis, unspecified: Secondary | ICD-10-CM | POA: Diagnosis not present

## 2019-04-28 DIAGNOSIS — L570 Actinic keratosis: Secondary | ICD-10-CM | POA: Diagnosis not present

## 2019-04-28 DIAGNOSIS — Z1231 Encounter for screening mammogram for malignant neoplasm of breast: Secondary | ICD-10-CM | POA: Diagnosis not present

## 2019-05-23 ENCOUNTER — Encounter: Payer: Self-pay | Admitting: Gastroenterology

## 2019-06-28 DIAGNOSIS — Z23 Encounter for immunization: Secondary | ICD-10-CM | POA: Diagnosis not present

## 2019-08-02 DIAGNOSIS — M79671 Pain in right foot: Secondary | ICD-10-CM | POA: Diagnosis not present

## 2019-08-02 DIAGNOSIS — Z6825 Body mass index (BMI) 25.0-25.9, adult: Secondary | ICD-10-CM | POA: Diagnosis not present

## 2019-08-11 DIAGNOSIS — Z0000 Encounter for general adult medical examination without abnormal findings: Secondary | ICD-10-CM | POA: Diagnosis not present

## 2019-08-11 DIAGNOSIS — Z20828 Contact with and (suspected) exposure to other viral communicable diseases: Secondary | ICD-10-CM | POA: Diagnosis not present

## 2019-08-11 DIAGNOSIS — Z6825 Body mass index (BMI) 25.0-25.9, adult: Secondary | ICD-10-CM | POA: Diagnosis not present

## 2019-08-11 DIAGNOSIS — J302 Other seasonal allergic rhinitis: Secondary | ICD-10-CM | POA: Diagnosis not present

## 2020-07-26 ENCOUNTER — Other Ambulatory Visit: Payer: Self-pay | Admitting: Cardiology

## 2020-07-26 ENCOUNTER — Other Ambulatory Visit: Payer: Self-pay | Admitting: Obstetrics and Gynecology

## 2020-07-26 DIAGNOSIS — N6452 Nipple discharge: Secondary | ICD-10-CM

## 2020-08-08 ENCOUNTER — Emergency Department (HOSPITAL_COMMUNITY): Payer: Medicare HMO

## 2020-08-08 ENCOUNTER — Other Ambulatory Visit: Payer: Self-pay

## 2020-08-08 ENCOUNTER — Encounter (HOSPITAL_COMMUNITY): Payer: Self-pay

## 2020-08-08 ENCOUNTER — Emergency Department (HOSPITAL_COMMUNITY)
Admission: EM | Admit: 2020-08-08 | Discharge: 2020-08-08 | Disposition: A | Discharge status: Home / Self Care | Payer: Medicare HMO | Attending: Emergency Medicine | Admitting: Emergency Medicine

## 2020-08-08 DIAGNOSIS — Z23 Encounter for immunization: Secondary | ICD-10-CM | POA: Diagnosis not present

## 2020-08-08 DIAGNOSIS — Y92410 Unspecified street and highway as the place of occurrence of the external cause: Secondary | ICD-10-CM | POA: Insufficient documentation

## 2020-08-08 DIAGNOSIS — M79642 Pain in left hand: Secondary | ICD-10-CM | POA: Insufficient documentation

## 2020-08-08 DIAGNOSIS — R519 Headache, unspecified: Secondary | ICD-10-CM | POA: Diagnosis not present

## 2020-08-08 DIAGNOSIS — G8929 Other chronic pain: Secondary | ICD-10-CM | POA: Insufficient documentation

## 2020-08-08 DIAGNOSIS — E039 Hypothyroidism, unspecified: Secondary | ICD-10-CM | POA: Diagnosis not present

## 2020-08-08 DIAGNOSIS — J45909 Unspecified asthma, uncomplicated: Secondary | ICD-10-CM | POA: Diagnosis not present

## 2020-08-08 DIAGNOSIS — S51811A Laceration without foreign body of right forearm, initial encounter: Secondary | ICD-10-CM | POA: Insufficient documentation

## 2020-08-08 DIAGNOSIS — M549 Dorsalgia, unspecified: Secondary | ICD-10-CM | POA: Insufficient documentation

## 2020-08-08 DIAGNOSIS — Z7982 Long term (current) use of aspirin: Secondary | ICD-10-CM | POA: Insufficient documentation

## 2020-08-08 DIAGNOSIS — S59911A Unspecified injury of right forearm, initial encounter: Secondary | ICD-10-CM | POA: Diagnosis present

## 2020-08-08 DIAGNOSIS — M542 Cervicalgia: Secondary | ICD-10-CM | POA: Diagnosis not present

## 2020-08-08 DIAGNOSIS — R52 Pain, unspecified: Secondary | ICD-10-CM

## 2020-08-08 DIAGNOSIS — Z79899 Other long term (current) drug therapy: Secondary | ICD-10-CM | POA: Diagnosis not present

## 2020-08-08 MED ORDER — METHOCARBAMOL 500 MG PO TABS: 500.0000 mg | ORAL_TABLET | Freq: Two times a day (BID) | ORAL | 0 refills | Status: DC

## 2020-08-08 MED ORDER — LIDOCAINE 5 % EX PTCH: 1.0000 | MEDICATED_PATCH | CUTANEOUS | 0 refills | Status: AC

## 2020-08-08 MED ORDER — TETANUS-DIPHTH-ACELL PERTUSSIS 5-2.5-18.5 LF-MCG/0.5 IM SUSY
0.5000 mL | PREFILLED_SYRINGE | Freq: Once | INTRAMUSCULAR | Status: AC
  Administered 2020-08-08: 0.5 mL via INTRAMUSCULAR
  Filled 2020-08-08: qty 0.5

## 2020-08-08 MED ORDER — HYDROCODONE-ACETAMINOPHEN 5-325 MG PO TABS: 1.0000 | ORAL_TABLET | ORAL | 0 refills | Status: AC | PRN
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
## 2020-08-08 NOTE — Discharge Instructions (Signed)
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
## 2020-08-16 ENCOUNTER — Ambulatory Visit
Admission: RE | Admit: 2020-08-16 | Discharge: 2020-08-16 | Disposition: A | Discharge status: Home / Self Care | Payer: Medicare HMO | Source: Ambulatory Visit | Attending: Obstetrics and Gynecology | Admitting: Obstetrics and Gynecology

## 2020-08-16 ENCOUNTER — Ambulatory Visit
Admission: RE | Admit: 2020-08-16 | Discharge: 2020-08-16 | Disposition: A | Discharge status: Home / Self Care | Payer: BLUE CROSS/BLUE SHIELD | Source: Ambulatory Visit | Attending: Obstetrics and Gynecology | Admitting: Obstetrics and Gynecology

## 2020-08-16 ENCOUNTER — Other Ambulatory Visit: Payer: Self-pay

## 2020-08-16 DIAGNOSIS — N6452 Nipple discharge: Secondary | ICD-10-CM

## 2021-02-18 ENCOUNTER — Encounter: Payer: Self-pay | Admitting: Gastroenterology

## 2021-03-13 ENCOUNTER — Ambulatory Visit (INDEPENDENT_AMBULATORY_CARE_PROVIDER_SITE_OTHER): Payer: Medicare HMO | Admitting: Gastroenterology

## 2021-03-13 ENCOUNTER — Encounter: Payer: Self-pay | Admitting: Gastroenterology

## 2021-03-13 VITALS — BP 122/68 | HR 83 | Ht 68.0 in | Wt 163.0 lb

## 2021-03-13 DIAGNOSIS — K5900 Constipation, unspecified: Secondary | ICD-10-CM

## 2021-03-13 DIAGNOSIS — R1310 Dysphagia, unspecified: Secondary | ICD-10-CM

## 2021-03-13 DIAGNOSIS — Z8719 Personal history of other diseases of the digestive system: Secondary | ICD-10-CM | POA: Insufficient documentation

## 2021-03-13 DIAGNOSIS — Z800 Family history of malignant neoplasm of digestive organs: Secondary | ICD-10-CM | POA: Diagnosis not present

## 2021-03-13 MED ORDER — NA SULFATE-K SULFATE-MG SULF 17.5-3.13-1.6 GM/177ML PO SOLN: 1.0000 | Freq: Once | ORAL | 0 refills | Status: AC

## 2021-03-13 NOTE — Patient Instructions (Signed)
Start Miralax 1 capful in 8 ounces of liquid daily.   You have been scheduled for a colonoscopy. Please follow written instructions given to you at your visit today.  Please pick up your prep supplies at the pharmacy within the next 1-3 days. If you use inhalers (even only as needed), please bring them with you on the day of your procedure.  Due to recent changes in healthcare laws, you may see the results of your imaging and laboratory studies on MyChart before your provider has had a chance to review them.  We understand that in some cases there may be results that are confusing or concerning to you. Not all laboratory results come back in the same time frame and the provider may be waiting for multiple results in order to interpret others.  Please give [place] 48 hours in order for your provider to thoroughly review all the results before contacting the office for clarification of your results.   If you are age 52 or older, your body mass index should be between 23-30. Your Body mass index is 24.78 kg/m. If this is out of the aforementioned range listed, please consider follow up with your Primary Care Provider.  If you are age 22 or younger, your body mass index should be between 19-25. Your Body mass index is 24.78 kg/m. If this is out of the aformentioned range listed, please consider follow up with your Primary Care Provider.   __________________________________________________________  The [facility] providers would like to encourage you to use [facility] to communicate with providers for non-urgent requests or questions.  Due to long hold times on the telephone, sending your provider a message by [facility] may be a faster and more efficient way to get a response.  Please allow 48 business hours for a response.  Please remember that this is for non-urgent requests.

## 2021-03-13 NOTE — Progress Notes (Signed)
03/13/2021 Kathleen Wall [id number] January 19, 1951   HISTORY OF PRESENT ILLNESS: This is a 70 year old female who is here today to discuss and schedule EGD and colonoscopy.  Last colonoscopy in September 2015 showed moderate diverticulosis and internal hemorrhoids.    She has family history of colon cancer in her brother.  Repeat has been recommended a 5-year interval.  She tells me that she tends to have constipation and describes her stools as pellet-like.  Then she will have an episode about once a month when she will have looser stools several times during the day.  She sees occasional pink-colored blood on the toilet paper upon wiping.  She describes some lower abdominal cramping over the past several months and thinks it is likely related to anxiety and nerves after her husband had been diagnosed with aggressive prostate cancer.  She also tells me that she has been experiencing issues with swallowing again for the past several months as well.  Several foods seem to be getting stuck when she eats.  She is on omeprazole 20 mg twice daily.  Last EGD in September 2015 she was found to have a stricture at the GE junction that was dilated with savory dilation.  She also had a small hiatal hernia.   Past Medical History:  Diagnosis Date  . Arthritis   . Asthma   . Diverticular disease   . Fibromyalgia   . GERD (gastroesophageal reflux disease)   . Hypercholesteremia   . Hypothyroidism   . Interstitial cystitis   . Irritable bladder   . Sleep apnea   . Thyroid disease   . TMJ (dislocation of temporomandibular joint)    left   Past Surgical History:  Procedure Laterality Date  . ABDOMINAL HYSTERECTOMY    . BREAST BIOPSY Right    over 10 yrs ago, Benign   . BREAST SURGERY Right    benign  . CATARACT EXTRACTION W/PHACO Left 08/25/2016   Procedure: CATARACT EXTRACTION PHACO AND INTRAOCULAR LENS PLACEMENT LEFT EYE CDE=10.19;  Surgeon: Tonny Branch, MD;  Location: [facility];  Service:  Ophthalmology;  Laterality: Left;  left  . CATARACT EXTRACTION W/PHACO Right 09/22/2016   Procedure: CATARACT EXTRACTION PHACO AND INTRAOCULAR LENS PLACEMENT RIGHT EYE CDE=9.79;  Surgeon: Tonny Branch, MD;  Location: [facility];  Service: Ophthalmology;  Laterality: Right;  . CERVICAL FUSION     plate inserted    reports that she has never smoked. She has never used smokeless tobacco. She reports that she does not drink alcohol and does not use drugs. family history includes Colitis in her father; Colon cancer in her paternal grandmother; Colon cancer (age of onset: 54) in her brother; Colon polyps in her father; Diverticulitis in her father; Liver cancer in her brother; Lung cancer in her brother. Allergies  Allergen Reactions  . Aspirin Other (See Comments)    Other Reaction: GI Upset with large doses      Outpatient Encounter Medications as of 03/13/2021  Medication Sig  . albuterol (PROVENTIL HFA;VENTOLIN HFA) 108 (90 Base) MCG/ACT inhaler Inhale 1-2 puffs into the lungs every 6 (six) hours as needed for wheezing or shortness of breath.  . Ascorbic Acid (VITAMIN C) 1000 MG tablet Take 1,000 mg by mouth daily.  Marland Kitchen aspirin EC 81 MG tablet Take 81 mg by mouth daily.  . Black Cohosh (REMIFEMIN MENOPAUSE PO) Take 1 capsule by mouth 2 (two) times daily.  . Calcium Carbonate-Vitamin D 600-200 MG-UNIT TABS Take 2 tablets by mouth daily.  Marland Kitchen  cholecalciferol (VITAMIN D3) 25 MCG (1000 UNIT) tablet Take 1,000 Units by mouth daily.  . cyclobenzaprine (FLEXERIL) 10 MG tablet Take 10 mg by mouth daily as needed for muscle spasms.  . Garlic 7425 MG CAPS Take 1,000 mg by mouth daily.  Marland Kitchen HYDROcodone-acetaminophen (NORCO/VICODIN) 5-325 MG tablet Take 1 tablet by mouth every 4 (four) hours as needed.  Marland Kitchen levothyroxine (SYNTHROID, LEVOTHROID) 75 MCG tablet Take 75 mcg by mouth daily before breakfast.   . lidocaine (LIDODERM) 5 % Place 1 patch onto the skin daily. Remove & Discard patch within 12 hours or as directed  by MD  . naproxen sodium (ANAPROX) 220 MG tablet Take 220 mg by mouth 2 (two) times daily as needed (for pain.).  Marland Kitchen Omega-3 Fatty Acids (FISH OIL ULTRA) 1400 MG CAPS Take 1,400 mg by mouth daily.  . simvastatin (ZOCOR) 20 MG tablet Take 20 mg by mouth at bedtime.   . Thiamine HCl (VITAMIN B-1) 250 MG tablet Take 250 mg by mouth daily.  . vitamin B-12 (CYANOCOBALAMIN) 1000 MCG tablet Take 1,000 mcg by mouth daily.  . Zinc 50 MG CAPS Take 50 mg by mouth daily.  Marland Kitchen omeprazole (PRILOSEC) 20 MG capsule Take 20 mg by mouth 2 (two) times daily before a meal. (Patient not taking: Reported on 03/13/2021)  . [DISCONTINUED] Besifloxacin HCl (BESIVANCE) 0.6 % SUSP Place 1 drop into the left eye 3 (three) times daily. Begin 3 days prior to procedure and continue to 1 week following procedure.  . [DISCONTINUED] Bromfenac Sodium (PROLENSA) 0.07 % SOLN Place 1 drop into the left eye daily.  . [DISCONTINUED] Difluprednate (DUREZOL) 0.05 % EMUL Place 1 drop into the left eye 4 (four) times daily.   . [DISCONTINUED] etodolac (LODINE) 500 MG tablet Take 500 mg by mouth 2 (two) times daily as needed (for pain/inflammation (seldom takes)).  . [DISCONTINUED] methocarbamol (ROBAXIN) 500 MG tablet Take 1 tablet (500 mg total) by mouth 2 (two) times daily.   No facility-administered encounter medications on file as of 03/13/2021.     REVIEW OF SYSTEMS  : All other systems reviewed and negative except where noted in the History of Present Illness.   PHYSICAL EXAM: BP 122/68   Pulse 83   Ht 5\' 8"  (1.727 m)   Wt 163 lb (73.9 kg)   SpO2 97%   BMI 24.78 kg/m  General: Well developed white female in no acute distress Head: Normocephalic and atraumatic Eyes:  Sclerae anicteric, conjunctiva pink. Ears: Normal auditory acuity Lungs: Clear throughout to auscultation; no W/R/R. Heart: Regular rate and rhythm; no M/R/G. Abdomen: Soft, non-distended.  BS present.  Non-tender. Rectal:  Will be done at the time of  colonoscopy. Musculoskeletal: Symmetrical with no gross deformities  Skin: No lesions on visible extremities Extremities: No edema  Neurological: Alert oriented x 4, grossly non-focal Psychological:  Alert and cooperative. Normal mood and affect  ASSESSMENT AND PLAN: *Family history of colon cancer in her brother: Patient's last colonoscopy was in September 2015.  We will schedule colonoscopy with Dr. Fuller Plan. *Constipation:  Will begin taking MiraLAX daily. *Dysphagia with history of esophageal stricture: Had dilation in September 2015.  Has been having symptoms again over the past several months.  We will schedule EGD with dilation with Dr. Fuller Plan also.  **The risks, benefits, and alternatives to EGD with dilation and colonoscopy were discussed with the patient and she consents to proceed.    CC:  Rory Percy, MD

## 2021-03-15 NOTE — Progress Notes (Signed)
Reviewed and agree with management plan.  Debra Calabretta T. Waylen Depaolo, MD FACG [phone]  

## 2021-04-29 ENCOUNTER — Encounter: Payer: Self-pay | Admitting: Gastroenterology

## 2021-07-23 ENCOUNTER — Encounter: Payer: Self-pay | Admitting: Gastroenterology

## 2021-07-23 ENCOUNTER — Other Ambulatory Visit: Payer: Self-pay

## 2021-07-23 ENCOUNTER — Ambulatory Visit (AMBULATORY_SURGERY_CENTER): Payer: Medicare HMO

## 2021-07-23 VITALS — Ht 68.0 in | Wt 160.0 lb

## 2021-07-23 DIAGNOSIS — Z8719 Personal history of other diseases of the digestive system: Secondary | ICD-10-CM

## 2021-07-23 DIAGNOSIS — K5900 Constipation, unspecified: Secondary | ICD-10-CM

## 2021-07-23 DIAGNOSIS — Z800 Family history of malignant neoplasm of digestive organs: Secondary | ICD-10-CM

## 2021-07-23 DIAGNOSIS — R1310 Dysphagia, unspecified: Secondary | ICD-10-CM

## 2021-07-23 NOTE — Progress Notes (Signed)
   Patient's pre-visit was done today over the phone with the patient   Name,DOB and address verified.   Patient denies any allergies to Eggs and Soy.  Patient denies any problems with anesthesia/sedation. Patient denies taking diet pills or blood thinners.  Denies atrial flutter or atrial fib Denies chronic constipation- goes daily to bathroom No home Oxygen.   Packet of Prep instructions mailed to patient including a copy of a consent form-pt is aware.  Patient understands to call us back with any questions or concerns.  Patient is aware of our care-partner policy and [id number] safety protocol.    Pt already has Suprep on hand and is in date.  Instructions changed to fit prep  Mild limitation to neck motion, but can move head left/right/up and down.

## 2021-08-06 ENCOUNTER — Ambulatory Visit (AMBULATORY_SURGERY_CENTER): Payer: Medicare HMO | Admitting: Gastroenterology

## 2021-08-06 ENCOUNTER — Encounter: Payer: Self-pay | Admitting: Gastroenterology

## 2021-08-06 VITALS — BP 96/72 | HR 57 | Temp 97.1°F | Resp 10 | Ht 68.0 in | Wt 160.0 lb

## 2021-08-06 DIAGNOSIS — D122 Benign neoplasm of ascending colon: Secondary | ICD-10-CM | POA: Diagnosis not present

## 2021-08-06 DIAGNOSIS — K5900 Constipation, unspecified: Secondary | ICD-10-CM

## 2021-08-06 DIAGNOSIS — D123 Benign neoplasm of transverse colon: Secondary | ICD-10-CM | POA: Diagnosis not present

## 2021-08-06 DIAGNOSIS — R1310 Dysphagia, unspecified: Secondary | ICD-10-CM

## 2021-08-06 DIAGNOSIS — K222 Esophageal obstruction: Secondary | ICD-10-CM

## 2021-08-06 DIAGNOSIS — Z8719 Personal history of other diseases of the digestive system: Secondary | ICD-10-CM | POA: Diagnosis not present

## 2021-08-06 DIAGNOSIS — Z800 Family history of malignant neoplasm of digestive organs: Secondary | ICD-10-CM | POA: Diagnosis present

## 2021-08-06 DIAGNOSIS — Z1211 Encounter for screening for malignant neoplasm of colon: Secondary | ICD-10-CM | POA: Diagnosis not present

## 2021-08-06 DIAGNOSIS — K449 Diaphragmatic hernia without obstruction or gangrene: Secondary | ICD-10-CM | POA: Diagnosis not present

## 2021-08-06 DIAGNOSIS — K2990 Gastroduodenitis, unspecified, without bleeding: Secondary | ICD-10-CM | POA: Diagnosis not present

## 2021-08-06 MED ORDER — SODIUM CHLORIDE 0.9 % IV SOLN: 500.0000 mL | Freq: Once | INTRAVENOUS | Status: AC

## 2021-08-06 MED ORDER — OMEPRAZOLE 20 MG PO CPDR: 20.0000 mg | DELAYED_RELEASE_CAPSULE | Freq: Every day | ORAL | 3 refills | Status: AC

## 2021-08-06 NOTE — Op Note (Signed)
[place] Patient Name: Kathleen Wall Procedure Date: 08/06/2021 10:14 AM MRN: [id number] Endoscopist: Ladene Artist , MD Age: 70 Referring MD:  Date of Birth: 06-20-1951 Gender: Female Account #: [id number] Procedure:                Colonoscopy Indications:              Screening patient at increased risk: Family history                            of colorectal cancer in one 1st-degree and one 2nd                            degree relative Medicines:                Monitored Anesthesia Care Procedure:                Pre-Anesthesia Assessment:                           - Prior to the procedure, a History and Physical                            was performed, and patient medications and                            allergies were reviewed. The patient's tolerance of                            previous anesthesia was also reviewed. The risks                            and benefits of the procedure and the sedation                            options and risks were discussed with the patient.                            All questions were answered, and informed consent                            was obtained. Prior Anticoagulants: The patient has                            taken no previous anticoagulant or antiplatelet                            agents. ASA Grade Assessment: III - A patient with                            severe systemic disease. After reviewing the risks                            and benefits, the patient was deemed in  satisfactory condition to undergo the procedure.                           After obtaining informed consent, the colonoscope                            was passed under direct vision. Throughout the                            procedure, the patient's blood pressure, pulse, and                            oxygen saturations were monitored continuously. The                            Olympus PCF-H190DL [id number])  Colonoscope was                            introduced through the anus and advanced to the the                            cecum, identified by appendiceal orifice and                            ileocecal valve. The ileocecal valve, appendiceal                            orifice, and rectum were photographed. The quality                            of the bowel preparation was adequate after                            extensive lavage and suction. The colonoscopy was                            performed without difficulty. The patient tolerated                            the procedure well. Scope In: 10:22:00 AM Scope Out: 10:41:27 AM Scope Withdrawal Time: 0 hours 16 minutes 40 seconds  Total Procedure Duration: 0 hours 19 minutes 27 seconds  Findings:                 The perianal and digital rectal examinations were                            normal.                           Four sessile polyps were found in the transverse                            colon (2) and ascending colon (2). The polyps were  6 to 8 mm in size. These polyps were removed with a                            cold snare. Resection and retrieval were complete.                           Multiple small-mouthed diverticula were found in                            the left colon. There was narrowing of the colon in                            association with the diverticular opening. There                            was evidence of diverticular spasm. There was no                            evidence of diverticular bleeding.                           The exam was otherwise without abnormality on                            direct and retroflexion views. Complications:            No immediate complications. Estimated blood loss:                            None. Estimated Blood Loss:     Estimated blood loss: none. Impression:               - Four 6 to 8 mm polyps in the transverse colon and                             in the ascending colon, removed with a cold snare.                            Resected and retrieved.                           - Moderate diverticulosis in the left colon.                           - The examination was otherwise normal on direct                            and retroflexion views. Recommendation:           - Repeat colonoscopy, likely 3 years, after studies                            are complete for surveillance based on pathology  results with a more extensive bowel prep.                           - Patient has a contact number available for                            emergencies. The signs and symptoms of potential                            delayed complications were discussed with the                            patient. Return to normal activities tomorrow.                            Written discharge instructions were provided to the                            patient.                           - High fiber diet.                           - Continue present medications.                           - Await pathology results. Ladene Artist, MD 08/06/2021 10:53:23 AM This report has been signed electronically.

## 2021-08-06 NOTE — Progress Notes (Signed)
History & Physical  Primary Care Physician:  Lavella Lemons, PA Primary Gastroenterologist: Lucio Edward, MD  CHIEF COMPLAINT: Family history of colon cancer, dysphagia  HPI: Kathleen Wall is a 70 y.o. female with a family history of colon cancer and recurrent dysphagia for EGD with dilation and colonoscopy.      Past Medical History:  Diagnosis Date   Arthritis    Asthma    Diverticular disease    Fibromyalgia    GERD (gastroesophageal reflux disease)    Hypercholesteremia    Hypothyroidism    Interstitial cystitis    Irritable bladder    Sleep apnea    Thyroid disease    TMJ (dislocation of temporomandibular joint)    left    Past Surgical History:  Procedure Laterality Date   ABDOMINAL HYSTERECTOMY     BREAST BIOPSY Right    over 10 yrs ago, Benign    BREAST SURGERY Right    benign   CATARACT EXTRACTION W/PHACO Left 08/25/2016   Procedure: CATARACT EXTRACTION PHACO AND INTRAOCULAR LENS PLACEMENT LEFT EYE CDE=10.19;  Surgeon: Tonny Branch, MD;  Location: [facility];  Service: Ophthalmology;  Laterality: Left;  left   CATARACT EXTRACTION W/PHACO Right 09/22/2016   Procedure: CATARACT EXTRACTION PHACO AND INTRAOCULAR LENS PLACEMENT RIGHT EYE CDE=9.79;  Surgeon: Tonny Branch, MD;  Location: [facility];  Service: Ophthalmology;  Laterality: Right;   CERVICAL FUSION     plate inserted    Prior to Admission medications   Medication Sig Start Date End Date Taking? Authorizing Provider  Ascorbic Acid (VITAMIN C) 1000 MG tablet Take 1,000 mg by mouth daily.   Yes [provider]  aspirin EC 81 MG tablet Take 81 mg by mouth daily.   Yes [provider]  Black Cohosh (REMIFEMIN MENOPAUSE PO) Take 1 capsule by mouth 2 (two) times daily.   Yes [provider]  Calcium Carbonate-Vitamin D 600-200 MG-UNIT TABS Take 2 tablets by mouth daily.   Yes [provider]  cholecalciferol (VITAMIN D3) 25 MCG (1000 UNIT) tablet Take 1,000 Units by mouth daily.    Yes [provider]  Garlic 7121 MG CAPS Take 1,000 mg by mouth daily.   Yes [provider]  ibuprofen (ADVIL) 800 MG tablet ibuprofen 800 mg tablet  TAKE 1 TABLET BY MOUTH EVERY 8 HOURS AS NEEDED   Yes [provider]  levothyroxine (SYNTHROID, LEVOTHROID) 75 MCG tablet Take 75 mcg by mouth daily before breakfast.    Yes [provider]  Multiple Vitamin (MULTIVITAMIN) capsule Take 1 capsule by mouth daily.   Yes [provider]  Omega-3 Fatty Acids (FISH OIL ULTRA) 1400 MG CAPS Take 1,400 mg by mouth daily.   Yes [provider]  simvastatin (ZOCOR) 20 MG tablet Take 20 mg by mouth at bedtime.  06/14/14  Yes [provider]  Thiamine HCl (VITAMIN B-1) 250 MG tablet Take 250 mg by mouth daily.   Yes [provider]  vitamin B-12 (CYANOCOBALAMIN) 1000 MCG tablet Take 1,000 mcg by mouth daily.   Yes [provider]  Zinc 50 MG CAPS Take 50 mg by mouth daily.   Yes [provider]  albuterol (PROVENTIL HFA;VENTOLIN HFA) 108 (90 Base) MCG/ACT inhaler Inhale 1-2 puffs into the lungs every 6 (six) hours as needed for wheezing or shortness of breath. Patient not taking: Reported on 07/23/2021    [provider]  cyclobenzaprine (FLEXERIL) 10 MG tablet Take 10 mg by mouth daily as  needed for muscle spasms.    [provider]  HYDROcodone-acetaminophen (NORCO/VICODIN) 5-325 MG tablet Take 1 tablet by mouth every 4 (four) hours as needed. Patient not taking: No sig reported 08/08/20   Henderly, Britni A, PA-C  lidocaine (LIDODERM) 5 % Place 1 patch onto the skin daily. Remove & Discard patch within 12 hours or as directed by MD Patient not taking: No sig reported 08/08/20   Henderly, Britni A, PA-C  naproxen sodium (ANAPROX) 220 MG tablet Take 220 mg by mouth 2 (two) times daily as needed (for pain.). Patient not taking: No sig reported    [provider]  omeprazole (PRILOSEC) 20 MG capsule  Take 20 mg by mouth 2 (two) times daily before a meal. Patient not taking: No sig reported    [provider]    Current Outpatient Medications  Medication Sig Dispense Refill   Ascorbic Acid (VITAMIN C) 1000 MG tablet Take 1,000 mg by mouth daily.     aspirin EC 81 MG tablet Take 81 mg by mouth daily.     Black Cohosh (REMIFEMIN MENOPAUSE PO) Take 1 capsule by mouth 2 (two) times daily.     Calcium Carbonate-Vitamin D 600-200 MG-UNIT TABS Take 2 tablets by mouth daily.     cholecalciferol (VITAMIN D3) 25 MCG (1000 UNIT) tablet Take 1,000 Units by mouth daily.     Garlic 9449 MG CAPS Take 1,000 mg by mouth daily.     ibuprofen (ADVIL) 800 MG tablet ibuprofen 800 mg tablet  TAKE 1 TABLET BY MOUTH EVERY 8 HOURS AS NEEDED     levothyroxine (SYNTHROID, LEVOTHROID) 75 MCG tablet Take 75 mcg by mouth daily before breakfast.      Multiple Vitamin (MULTIVITAMIN) capsule Take 1 capsule by mouth daily.     Omega-3 Fatty Acids (FISH OIL ULTRA) 1400 MG CAPS Take 1,400 mg by mouth daily.     simvastatin (ZOCOR) 20 MG tablet Take 20 mg by mouth at bedtime.      Thiamine HCl (VITAMIN B-1) 250 MG tablet Take 250 mg by mouth daily.     vitamin B-12 (CYANOCOBALAMIN) 1000 MCG tablet Take 1,000 mcg by mouth daily.     Zinc 50 MG CAPS Take 50 mg by mouth daily.     albuterol (PROVENTIL HFA;VENTOLIN HFA) 108 (90 Base) MCG/ACT inhaler Inhale 1-2 puffs into the lungs every 6 (six) hours as needed for wheezing or shortness of breath. (Patient not taking: Reported on 07/23/2021)     cyclobenzaprine (FLEXERIL) 10 MG tablet Take 10 mg by mouth daily as needed for muscle spasms.     HYDROcodone-acetaminophen (NORCO/VICODIN) 5-325 MG tablet Take 1 tablet by mouth every 4 (four) hours as needed. (Patient not taking: No sig reported) 10 tablet 0   lidocaine (LIDODERM) 5 % Place 1 patch onto the skin daily. Remove & Discard patch within 12 hours or as directed by MD (Patient not taking: No sig reported) 30 patch 0    naproxen sodium (ANAPROX) 220 MG tablet Take 220 mg by mouth 2 (two) times daily as needed (for pain.). (Patient not taking: No sig reported)     omeprazole (PRILOSEC) 20 MG capsule Take 20 mg by mouth 2 (two) times daily before a meal. (Patient not taking: No sig reported)     Current Facility-Administered Medications  Medication Dose Route Frequency Provider Last Rate Last Admin   0.9 %  sodium chloride infusion  500 mL Intravenous Once Ladene Artist, MD  Allergies as of 08/06/2021 - Review Complete 08/06/2021  Allergen Reaction Noted   Aspirin Other (See Comments)     Family History  Problem Relation Age of Onset   Diverticulitis Father    Colitis Father    Colon polyps Father    Colon cancer Brother 53   Liver cancer Brother    Lung cancer Brother    Colon cancer Paternal Grandmother    Esophageal cancer Neg Hx    Rectal cancer Neg Hx    Stomach cancer Neg Hx     Social History   Socioeconomic History   Marital status: Married    Spouse name: Not on file   Number of children: Not on file   Years of education: Not on file   Highest education level: Not on file  Occupational History   Occupation: [employer]  Tobacco Use   Smoking status: Never   Smokeless tobacco: Never  Vaping Use   Vaping Use: Never used  Substance and Sexual Activity   Alcohol use: No   Drug use: No   Sexual activity: Yes    Birth control/protection: Surgical  Other Topics Concern   Not on file  Social History Narrative   Daily caffeine   Social Determinants of Health   Financial Resource Strain: Not on file  Food Insecurity: Not on file  Transportation Needs: Not on file  Physical Activity: Not on file  Stress: Not on file  Social Connections: Not on file  Intimate Partner Violence: Not on file    Review of Systems:  All systems reviewed an negative except where noted in HPI.  Gen: Denies any fever, chills, sweats, anorexia, fatigue, weakness, malaise, weight  loss, and sleep disorder CV: Denies chest pain, angina, palpitations, syncope, orthopnea, PND, peripheral edema, and claudication. Resp: Denies dyspnea at rest, dyspnea with exercise, cough, sputum, wheezing, coughing up blood, and pleurisy. GI: Denies vomiting blood, jaundice, and fecal incontinence.   Denies dysphagia or odynophagia. GU : Denies urinary burning, blood in urine, urinary frequency, urinary hesitancy, nocturnal urination, and urinary incontinence. MS: Denies joint pain, limitation of movement, and swelling, stiffness, low back pain, extremity pain. Denies muscle weakness, cramps, atrophy.  Derm: Denies rash, itching, dry skin, hives, moles, warts, or unhealing ulcers.  Psych: Denies depression, anxiety, memory loss, suicidal ideation, hallucinations, paranoia, and confusion. Heme: Denies bruising, bleeding, and enlarged lymph nodes. Neuro:  Denies any headaches, dizziness, paresthesias. Endo:  Denies any problems with DM, thyroid, adrenal function.   Physical Exam: General:  Alert, well-developed, in NAD Head:  Normocephalic and atraumatic. Eyes:  Sclera clear, no icterus.   Conjunctiva pink. Ears:  Normal auditory acuity. Mouth:  No deformity or lesions.  Neck:  Supple; no masses . Lungs:  Clear throughout to auscultation.   No wheezes, crackles, or rhonchi. No acute distress. Heart:  Regular rate and rhythm; no murmurs. Abdomen:  Soft, nondistended, nontender. No masses, hepatomegaly. No obvious masses.  Normal bowel .    Rectal:  Deferred   Msk:  Symmetrical without gross deformities.. Pulses:  Normal pulses noted. Extremities:  Without edema. Neurologic:  Alert and  oriented x4;  grossly normal neurologically. Skin:  Intact without significant lesions or rashes. Cervical Nodes:  No significant cervical adenopathy. Psych:  Alert and cooperative. Normal mood and affect.   Impression / Plan:    Family history of colon cancer in her brother and paternal grandmother  for colonoscopy.Marland Kitchen Dysphagia, GERD, history of esophageal stricture for EGD and dilation.  This patient is appropriate for endoscopic procedures in the ambulatory setting.    Pricilla Riffle. Fuller Plan  08/06/2021, 10:19 AM See Shea Evans, [facility], to contact our on call provider

## 2021-08-06 NOTE — Patient Instructions (Signed)
Handouts on hiatal hernia, stricture, diverticulosis, polyps, high fiber diet, and dilation diet.  Await pathology results. Clear liquid diet for 2 hours then advance as tolerated to soft diet today. Resume previous diet tomorrow. Follow antireflux measures long term. Resume Omeprazole 20 mg daily, 1 year of refills. - sent prescription to [facility] in [place] to be picked up today.  GI office visit in 1 year.  Repeat colonoscopy for surveillance will be determined based off of pathology results.     YOU HAD AN ENDOSCOPIC PROCEDURE TODAY AT [facility]:   Refer to the procedure report that was given to you for any specific questions about what was found during the examination.  If the procedure report does not answer your questions, please call your gastroenterologist to clarify.  If you requested that your care partner not be given the details of your procedure findings, then the procedure report has been included in a sealed envelope for you to review at your convenience later.  YOU SHOULD EXPECT: Some feelings of bloating in the abdomen. Passage of more gas than usual.  Walking can help get rid of the air that was put into your GI tract during the procedure and reduce the bloating. If you had a lower endoscopy (such as a colonoscopy or flexible sigmoidoscopy) you may notice spotting of blood in your stool or on the toilet paper. If you underwent a bowel prep for your procedure, you may not have a normal bowel movement for a few days.  Please Note:  You might notice some irritation and congestion in your nose or some drainage.  This is from the oxygen used during your procedure.  There is no need for concern and it should clear up in a day or so.  SYMPTOMS TO REPORT IMMEDIATELY:  Following lower endoscopy (colonoscopy or flexible sigmoidoscopy):  Excessive amounts of blood in the stool  Significant tenderness or worsening of abdominal pains  Swelling of the abdomen that  is new, acute  Fever of 100F or higher  Following upper endoscopy (EGD)  Vomiting of blood or coffee ground material  New chest pain or pain under the shoulder blades  Painful or persistently difficult swallowing  New shortness of breath  Fever of 100F or higher  Black, tarry-looking stools  For urgent or emergent issues, a gastroenterologist can be reached at any hour by calling [phone]. Do not use MyChart messaging for urgent concerns.    DIET:  We do recommend a small meal at first, but then you may proceed to your regular diet.  Drink plenty of fluids but you should avoid alcoholic beverages for 24 hours.  ACTIVITY:  You should plan to take it easy for the rest of today and you should NOT DRIVE or use heavy machinery until tomorrow (because of the sedation medicines used during the test).    FOLLOW UP: Our staff will call the number listed on your records 48-72 hours following your procedure to check on you and address any questions or concerns that you may have regarding the information given to you following your procedure. If we do not reach you, we will leave a message.  We will attempt to reach you two times.  During this call, we will ask if you have developed any symptoms of COVID 19. If you develop any symptoms (ie: fever, flu-like symptoms, shortness of breath, cough etc.) before then, please call [phone].  If you test positive for Covid 19 in the 2 weeks  post procedure, please call and report this information to [place].    If any biopsies were taken you will be contacted by phone or by letter within the next 1-3 weeks.  Please call us at [phone] if you have not heard about the biopsies in 3 weeks.    SIGNATURES/CONFIDENTIALITY: You and/or your care partner have signed paperwork which will be entered into your electronic medical record.  These signatures attest to the fact that that the information above on your After Visit Summary has been reviewed and is  understood.  Full responsibility of the confidentiality of this discharge information lies with you and/or your care-partner.

## 2021-08-06 NOTE — Progress Notes (Signed)
Sedate, gd SR, tolerated procedure well, VSS, report to RN 

## 2021-08-06 NOTE — Progress Notes (Signed)
Called to room to assist during endoscopic procedure.  Patient ID and intended procedure confirmed with present staff. Received instructions for my participation in the procedure from the performing physician.  

## 2021-08-06 NOTE — Op Note (Signed)
[facility] Patient Name: Kathleen Wall Procedure Date: 08/06/2021 10:14 AM MRN: [id number] Endoscopist: Ladene Artist , MD Age: 70 Referring MD:  Date of Birth: [birth year] Gender: Female Account #: [id number] Procedure:                Upper GI endoscopy Indications:              Dysphagia Medicines:                Monitored Anesthesia Care Procedure:                Pre-Anesthesia Assessment:                           - Prior to the procedure, a History and Physical                            was performed, and patient medications and                            allergies were reviewed. The patient's tolerance of                            previous anesthesia was also reviewed. The risks                            and benefits of the procedure and the sedation                            options and risks were discussed with the patient.                            All questions were answered, and informed consent                            was obtained. Prior Anticoagulants: The patient has                            taken no previous anticoagulant or antiplatelet                            agents. ASA Grade Assessment: III - A patient with                            severe systemic disease. After reviewing the risks                            and benefits, the patient was deemed in                            satisfactory condition to undergo the procedure.                           After obtaining informed consent, the endoscope was  passed under direct vision. Throughout the                            procedure, the patient's blood pressure, pulse, and                            oxygen saturations were monitored continuously. The                            Endoscope was introduced through the mouth, and                            advanced to the second part of duodenum. The upper                            GI endoscopy was accomplished without  difficulty.                            The patient tolerated the procedure well. Scope In: Scope Out: Findings:                 One benign-appearing, intrinsic moderate stenosis                            was found at the gastroesophageal junction. This                            stenosis measured 1.3 cm (inner diameter) x less                            than one cm (in length). The stenosis was                            traversed. A guidewire was placed and the scope was                            withdrawn. Dilation was performed with a Savary                            dilator with mild resistance at 16 mm.                           No other significant abnormalities were identified                            in a careful examination of the esophagus.                           A small hiatal hernia was present.                           The exam of the stomach was otherwise normal.  Patchy mildly erythematous mucosa without active                            bleeding and with no stigmata of bleeding was found                            in the duodenal bulb.                           The exam of the duodenum was otherwise normal. Complications:            No immediate complications. Estimated Blood Loss:     Estimated blood loss was minimal. Impression:               - Benign-appearing esophageal stenosis. Dilated.                           - Small hiatal hernia.                           - Erythematous duodenopathy.                           - No specimens collected. Recommendation:           - Patient has a contact number available for                            emergencies. The signs and symptoms of potential                            delayed complications were discussed with the                            patient. Return to normal activities tomorrow.                            Written discharge instructions were provided to the                             patient.                           - Clear liquid diet for 2 hours, then advance as                            tolerated to soft diet today.                           - Resume prior diet tomorrow.                           - Follow antireflux measures long term.                           - Continue present medications.                           -  Resume omeprazole 20 mg po qd, 1 year of refills.                           - GI office visit in 1 year. Ladene Artist, MD 08/06/2021 10:58:00 AM This report has been signed electronically.

## 2021-08-06 NOTE — Progress Notes (Signed)
Pt's states no medical or surgical changes since previsit or office visit. 

## 2021-08-08 ENCOUNTER — Telehealth: Payer: Self-pay

## 2021-08-08 ENCOUNTER — Telehealth: Payer: Self-pay | Admitting: *Deleted

## 2021-08-08 NOTE — Telephone Encounter (Signed)
No answer for post procedure call back. LEft VM. 

## 2021-08-08 NOTE — Telephone Encounter (Signed)
NO ANSWER, MESSAGE LEFT FOR PATIENT. 

## 2021-08-16 ENCOUNTER — Encounter: Payer: Self-pay | Admitting: Gastroenterology
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
## 2022-06-30 ENCOUNTER — Ambulatory Visit: Payer: Medicare HMO | Admitting: Physician Assistant

## 2022-06-30 ENCOUNTER — Encounter: Payer: Self-pay | Admitting: Physician Assistant

## 2022-06-30 VITALS — BP 112/70 | HR 82 | Ht 68.0 in | Wt 162.0 lb

## 2022-06-30 DIAGNOSIS — R152 Fecal urgency: Secondary | ICD-10-CM | POA: Diagnosis not present

## 2022-06-30 DIAGNOSIS — K219 Gastro-esophageal reflux disease without esophagitis: Secondary | ICD-10-CM | POA: Diagnosis not present

## 2022-06-30 DIAGNOSIS — R0789 Other chest pain: Secondary | ICD-10-CM

## 2022-06-30 DIAGNOSIS — K224 Dyskinesia of esophagus: Secondary | ICD-10-CM | POA: Diagnosis not present

## 2022-06-30 MED ORDER — HYOSCYAMINE SULFATE 0.125 MG SL SUBL: SUBLINGUAL_TABLET | SUBLINGUAL | 3 refills | Status: DC

## 2022-06-30 MED ORDER — OMEPRAZOLE 40 MG PO CPDR: 40.0000 mg | DELAYED_RELEASE_CAPSULE | Freq: Every day | ORAL | 3 refills | Status: AC

## 2022-06-30 NOTE — Progress Notes (Signed)
Subjective:    Patient ID: Kathleen Wall, female    DOB: [birth year], 71 y.o.   MRN: [id number]  HPI Kathleen Wall is a pleasant 71 year old white female, established with Dr. Fuller Plan.  She was last seen here in June 2022 and comes in today after a recent episode of diarrhea. She last had colonoscopy in October 2022 with removal of 4 polyps, these were all 6 to 8 mm in size and path showed them to be tubular adenomas.  Also noted multiple left colon and sigmoid diverticulosis Indicated for 3-year interval follow-up. Also had EGD at that same setting with dilation of a benign distal esophageal stricture and noted to have a small hiatal hernia.  She also has history of interstitial cystitis, and family history of colon cancer. Says that she had a significant episode of diarrhea when she called for the appointment last month.  This lasted for about 4 days with multiple bowel movements per day and was associated with nausea and vomiting.  She did not have any fever or chills, no melena or hematochezia. No known infectious exposures.  Bowels are now back to normal but she has been concerned because she will have periodic bouts of loose stools.  She relates being constipated and then randomly having an episode of multiple loose stools in 1 day, then going back to normal bowel movements and/or constipation.  She had an episode of diarrhea in February 2023 and GI path panel here was positive for Campylobacter.. She is also complaining of a vague fullness sensation in her chest which she feels is her esophagus.  She wonders if she may be having some esophageal spasms.  This has been occurring more frequently recently, not necessarily associated with eating or being postprandially, symptoms are not exertional, no associated shortness of breath weakness etc. She is currently on omeprazole 20 mg p.o. twice daily AC.  Review of Systems. Pertinent positive and negative review of systems were noted in the above HPI section.   All other review of systems was otherwise negative.   Outpatient Encounter Medications as of 06/30/2022  Medication Sig   aspirin EC 81 MG tablet Take 81 mg by mouth daily.   Black Cohosh (REMIFEMIN MENOPAUSE PO) Take 1 capsule by mouth 2 (two) times daily.   Calcium Carbonate-Vitamin D 600-200 MG-UNIT TABS Take 2 tablets by mouth daily.   cholecalciferol (VITAMIN D3) 25 MCG (1000 UNIT) tablet Take 1,000 Units by mouth daily.   cyclobenzaprine (FLEXERIL) 10 MG tablet Take 10 mg by mouth daily as needed for muscle spasms.   hyoscyamine (LEVSIN SL) 0.125 MG SL tablet 1 tablet every 4-6 hours prn   ibuprofen (ADVIL) 800 MG tablet ibuprofen 800 mg tablet  TAKE 1 TABLET BY MOUTH EVERY 8 HOURS AS NEEDED   levothyroxine (SYNTHROID, LEVOTHROID) 75 MCG tablet Take 75 mcg by mouth daily before breakfast.    lidocaine (LIDODERM) 5 % Place 1 patch onto the skin daily. Remove & Discard patch within 12 hours or as directed by MD   Multiple Vitamin (MULTIVITAMIN) capsule Take 1 capsule by mouth daily.   naproxen sodium (ANAPROX) 220 MG tablet Take 220 mg by mouth 2 (two) times daily as needed (for pain.).   Omega-3 Fatty Acids (FISH OIL ULTRA) 1400 MG CAPS Take 1,400 mg by mouth daily.   omeprazole (PRILOSEC) 20 MG capsule Take 20 mg by mouth 2 (two) times daily before a meal.   omeprazole (PRILOSEC) 40 MG capsule Take 1 capsule (40 mg total) by  mouth daily. Take 40 mg in the morning and 20 mg in the evening   simvastatin (ZOCOR) 20 MG tablet Take 20 mg by mouth at bedtime.    albuterol (PROVENTIL HFA;VENTOLIN HFA) 108 (90 Base) MCG/ACT inhaler Inhale 1-2 puffs into the lungs every 6 (six) hours as needed for wheezing or shortness of breath. (Patient not taking: Reported on 07/23/2021)   Ascorbic Acid (VITAMIN C) 1000 MG tablet Take 1,000 mg by mouth daily.   Garlic 3614 MG CAPS Take 1,000 mg by mouth daily.   HYDROcodone-acetaminophen (NORCO/VICODIN) 5-325 MG tablet Take 1 tablet by mouth every 4 (four)  hours as needed. (Patient not taking: No sig reported)   omeprazole (PRILOSEC) 20 MG capsule Take 1 capsule (20 mg total) by mouth daily.   Thiamine HCl (VITAMIN B-1) 250 MG tablet Take 250 mg by mouth daily.   vitamin B-12 (CYANOCOBALAMIN) 1000 MCG tablet Take 1,000 mcg by mouth daily.   Zinc 50 MG CAPS Take 50 mg by mouth daily.   Facility-Administered Encounter Medications as of 06/30/2022  Medication   0.9 %  sodium chloride infusion   Allergies  Allergen Reactions   Aspirin Other (See Comments)    Other Reaction: GI Upset with large doses   Patient Active Problem List   Diagnosis Date Noted   Dysphagia 03/13/2021   History of esophageal stricture 03/13/2021   Family history of colon cancer 03/13/2021   Constipation 03/13/2021   Diverticulosis 11/27/2011   Interstitial cystitis 11/27/2011   Social History   Socioeconomic History   Marital status: Married    Spouse name: Not on file   Number of children: Not on file   Years of education: Not on file   Highest education level: Not on file  Occupational History   Occupation: [employer]  Tobacco Use   Smoking status: Never   Smokeless tobacco: Never  Vaping Use   Vaping Use: Never used  Substance and Sexual Activity   Alcohol use: No   Drug use: No   Sexual activity: Yes    Birth control/protection: Surgical  Other Topics Concern   Not on file  Social History Narrative   Daily caffeine   Social Determinants of Health   Financial Resource Strain: Not on file  Food Insecurity: Not on file  Transportation Needs: Not on file  Physical Activity: Not on file  Stress: Not on file  Social Connections: Not on file  Intimate Partner Violence: Not on file    Ms. Zody's family history includes Colitis in her father; Colon cancer in her paternal grandmother; Colon cancer (age of onset: 82) in her brother; Colon polyps in her father; Diverticulitis in her father; Liver cancer in her brother; Lung cancer in her  brother.      Objective:    Vitals:   06/30/22 0918  BP: 112/70  Pulse: 82    Physical Exam. Well-developed well-nourished older white female in no acute distress.  Height, Weight, 162 BMI 24.6  HEENT; nontraumatic normocephalic, EOMI, PE R LA, sclera anicteric. Oropharynx; not examined today Neck; supple, no JVD Cardiovascular; regular rate and rhythm with S1-S2, no murmur rub or gallop Pulmonary; Clear bilaterally Abdomen; soft, nontender, nondistended, no palpable mass or hepatosplenomegaly, bowel sounds are active Rectal; not done today Skin; benign exam, no jaundice rash or appreciable lesions Extremities; no clubbing cyanosis or edema skin warm and dry Neuro/Psych; alert and oriented x4, grossly nonfocal mood and affect appropriate        Assessment &  Plan:   #58 71 year old white female with history of infectious colitis February 2023/Campylobacter, now reporting periodic bouts of loose stools, followed by constipation or normal bowel movements. She had an episode in August with diarrhea nausea and vomiting which lasted for about 4 days then resolved.  I suspect she had another episode of infectious gastroenteritis in August, perhaps viral  Her intermittent bouts of loose stools alternating with normal bowel movements or days of constipation most consistent with IBS type symptoms.  #2 history of adenomatous colon polyps-up-to-date with colonoscopy last done October 2022-indicated for 3-year interval follow-up #3.  Diverticulosis and prior history of diverticulitis-most recent symptoms not associated with any abdominal pain suggestive of diverticulitis  #4 chronic GERD, and history of distal esophageal stricture status post Savary dilation October 2022. No current dysphagia symptoms, however she is having fullness sensation and discomfort in the lower chest, despite omeprazole 20 mg p.o. twice daily  Suspect this may be poorly controlled GERD, rule out component of  dysmotility or spasm.  Plan; Continue antireflux regimen. Increase omeprazole to 40 mg p.o. every morning AC breakfast and continue 20 mg AC dinner.  New prescription was sent for 40 mg tablets. Trial of Levsin sublingual every 6 hours as needed-she can try this for episodic diarrhea and also advised to try if she senses esophageal spasm. Will schedule for barium swallow with tablet  Further recommendations pending results of barium swallow  Eustace Hur S Evagelia Knack PA-C 06/30/2022   Cc: Lavella Lemons, [place]

## 2022-06-30 NOTE — Patient Instructions (Signed)
If you are age 69 or older, your body mass index should be between 23-30. Your Body mass index is 24.63 kg/m. If this is out of the aforementioned range listed, please consider follow up with your Primary Care Provider.  If you are age 24 or younger, your body mass index should be between 19-25. Your Body mass index is 24.63 kg/m. If this is out of the aformentioned range listed, please consider follow up with your Primary Care Provider.   ________________________________________________________  The [facility] providers would like to encourage you to use [facility] to communicate with providers for non-urgent requests or questions.  Due to long hold times on the telephone, sending your provider a message by [facility] may be a faster and more efficient way to get a response.  Please allow 48 business hours for a response.  Please remember that this is for non-urgent requests.  _______________________________________________________   We have sent the following medications to your pharmacy for you to pick up at your convenience: Levsin and Omeprazole  You have been scheduled for a Barium Esophogram at [facility] (1st floor of the hospital) on 07/02/22 at 9:30 am. Please arrive 15 minutes prior to your appointment for registration. Make certain not to have anything to eat or drink 3 hours prior to your test. If you need to reschedule for any reason, please contact radiology at [phone] to do so. __________________________________________________________________ A barium swallow is an examination that concentrates on views of the esophagus. This tends to be a double contrast exam (barium and two liquids which, when combined, create a gas to distend the wall of the oesophagus) or single contrast (non-ionic iodine based). The study is usually tailored to your symptoms so a good history is essential. Attention is paid during the study to the form, structure and configuration of the esophagus, looking for  functional disorders (such as aspiration, dysphagia, achalasia, motility and reflux) EXAMINATION You may be asked to change into a gown, depending on the type of swallow being performed. A radiologist and radiographer will perform the procedure. The radiologist will advise you of the type of contrast selected for your procedure and direct you during the exam. You will be asked to stand, sit or lie in several different positions and to hold a small amount of fluid in your mouth before being asked to swallow while the imaging is performed .In some instances you may be asked to swallow barium coated marshmallows to assess the motility of a solid food bolus. The exam can be recorded as a digital or video fluoroscopy procedure. POST PROCEDURE It will take 1-2 days for the barium to pass through your system. To facilitate this, it is important, unless otherwise directed, to increase your fluids for the next 24-48hrs and to resume your normal diet.  This test typically takes about 30 minutes to perform.  Due to recent changes in healthcare laws, you may see the results of your imaging and laboratory studies on MyChart before your provider has had a chance to review them.  We understand that in some cases there may be results that are confusing or concerning to you. Not all laboratory results come back in the same time frame and the provider may be waiting for multiple results in order to interpret others.  Please give [place] 48 hours in order for your provider to thoroughly review all the results before contacting the office for clarification of your results.    It was a pleasure to see you today!  Thank you for trusting me with your gastrointestinal care!     __________________________________________________________________________________

## 2022-07-02 ENCOUNTER — Ambulatory Visit (HOSPITAL_COMMUNITY)
Admission: RE | Admit: 2022-07-02 | Discharge: 2022-07-02 | Disposition: A | Discharge status: Home / Self Care | Payer: Medicare HMO | Source: Ambulatory Visit | Attending: Physician Assistant | Admitting: Physician Assistant

## 2022-07-02 DIAGNOSIS — K224 Dyskinesia of esophagus: Secondary | ICD-10-CM | POA: Diagnosis not present

## 2022-07-02 DIAGNOSIS — R0789 Other chest pain: Secondary | ICD-10-CM | POA: Insufficient documentation

## 2022-07-02 DIAGNOSIS — K219 Gastro-esophageal reflux disease without esophagitis: Secondary | ICD-10-CM | POA: Insufficient documentation

## 2023-07-03 ENCOUNTER — Ambulatory Visit: Payer: Medicare HMO | Admitting: Gastroenterology

## 2023-07-03 ENCOUNTER — Encounter: Payer: Self-pay | Admitting: Gastroenterology

## 2023-07-03 VITALS — BP 130/80 | HR 81 | Ht 68.0 in | Wt 170.0 lb

## 2023-07-03 DIAGNOSIS — Z86010 Personal history of colonic polyps: Secondary | ICD-10-CM

## 2023-07-03 DIAGNOSIS — K5900 Constipation, unspecified: Secondary | ICD-10-CM | POA: Diagnosis not present

## 2023-07-03 DIAGNOSIS — K219 Gastro-esophageal reflux disease without esophagitis: Secondary | ICD-10-CM

## 2023-07-03 DIAGNOSIS — K5732 Diverticulitis of large intestine without perforation or abscess without bleeding: Secondary | ICD-10-CM | POA: Diagnosis not present

## 2023-07-03 MED ORDER — HYOSCYAMINE SULFATE 0.125 MG SL SUBL: SUBLINGUAL_TABLET | SUBLINGUAL | 3 refills | Status: AC

## 2023-07-03 MED ORDER — AMOXICILLIN-POT CLAVULANATE 875-125 MG PO TABS: 1.0000 | ORAL_TABLET | Freq: Two times a day (BID) | ORAL | 0 refills | Status: AC

## 2023-07-03 NOTE — Progress Notes (Signed)
Assessment     Recent diverticulitis GERD with history of an esophageal stricture and small HH Personal history of adenomatous colon polyps  Long-term fecal incontinence   Recommendations    Benefiber or Citrucel every day with adequate daily water intake Augmentin 875 mg bid, #14 prn during traveling for recurrent diverticulitis Refill Levsin Continue omeprazole 40 mg qam and 20 mg qpm Kegel exercises 5 times daily long term Return to orthopedic surgeon for further evaluation of back pain and fecal incontinence Surveillance colonoscopy in October 2025   HPI    This is a 72 year old female returning for follow-up after treatment for acute diverticulitis in July.  She developed acute left lower quadrant abdominal pain with a change in bowel movements, fevers and mild rectal bleeding in July.  She was evaluated at [facility] in [place].  CT AP performed with IV contrast on April 20, 2023 showing the following GI findings: cholelithiasis, colonic diverticulosis, distal descending colon wall thickening with adjacent fat stranding compatible with acute diverticulitis, no abscess, free air or free fluid.  Status post hysterectomy.  She was treated with a course of Augmentin.  Her symptoms improved but did not resolve.  She relates that her PCP treated her with a second course of Augmentin and her symptoms resolved.  She had recurrent symptoms a few weeks later and was treated with another course of Augmentin.  She notes change in bowel habits between normal and small hard stools.  She states she has mild left lower quadrant pain intermittently particularly associated with constipation.  She has had several years of occasional fecal incontinence and relates undergoing physical therapy for this problem years ago.  She relates ongoing and worsening back problems with right leg pain and numbness and weakness.  She is concerned she will develop acute diverticulitis again when  traveling this summer to [facility] and requests of prescription in case symptoms develop. Denies weight loss, diarrhea, change in stool caliber, melena, hematochezia, nausea, vomiting, dysphagia, reflux symptoms, chest pain.   Colonoscopy October 2022 - Four 6 to 8 mm polyps in the transverse colon and in the ascending colon, removed with a cold snare. Resected and retrieved.  - Moderate diverticulosis in the left colon.  - The examination was otherwise normal on direct and retroflexion views   Labs / Imaging        No data to display             Latest Ref Rng & Units 08/21/2016    1:19 PM 11/27/2011    3:11 PM 03/17/2007    7:39 AM  CBC  WBC 4.0 - 10.5 K/uL 9.3  12.8    Hemoglobin 12.0 - 15.0 g/dL 09.8  11.9  14.7   Hematocrit 36.0 - 46.0 % 41.1  37.5    Platelets 150 - 400 K/uL 316  275.0       DG ESOPHAGUS W SINGLE CM (SOL OR THIN BA) CLINICAL DATA:  Chest pain. History of endoscopy and esophageal stretching.  EXAM: ESOPHOGRAM/BARIUM SWALLOW  TECHNIQUE: Single contrast examination was performed using  thin barium.  FLUOROSCOPY: Radiation Exposure Index (as provided by the fluoroscopic device): 8.9 mGy Kerma  COMPARISON:  None Available.  FINDINGS: No definite mass or stricture is noted in the esophagus. Small sliding-type hiatal hernia is noted. No definite reflux is noted. 13 mm barium tablet passed through esophagus and into stomach without difficulty or delay.  IMPRESSION: Small sliding-type hiatal hernia. No  other abnormality seen in the esophagus.  Electronically Signed   By: Lupita Raider M.D.   On: 07/02/2022 09:56   Current Medications, Allergies, Past Medical History, Past Surgical History, Family History and Social History were reviewed in [employer] record.   Physical Exam: General: Well developed, well nourished, no acute distress Head: Normocephalic and atraumatic Eyes: Sclerae anicteric, EOMI Ears: Normal  auditory acuity Mouth: No deformities or lesions noted Lungs: Clear throughout to auscultation Heart: Regular rate and rhythm; No murmurs, rubs or bruits Abdomen: Soft, non tender and non distended. No masses, hepatosplenomegaly or hernias noted. Normal Bowel sounds Rectal: Not done Musculoskeletal: Symmetrical with no gross deformities  Pulses:  Normal pulses noted Extremities: No edema or deformities noted Neurological: Alert oriented x 4, grossly nonfocal Psychological:  Alert and cooperative. Normal mood and affect   Kathleen Wall T. Russella Dar, MD 07/03/2023, 10:17 AM

## 2023-07-03 NOTE — Patient Instructions (Signed)
We have sent the following medications to your pharmacy for you to pick up at your convenience: Levsin and Augmentin to have on hand if needed.   Please purchase the following medications over the counter and take as directed:Citracel clear or Benefiber daily.   The [facility] providers would like to encourage you to use [facility] to communicate with providers for non-urgent requests or questions.  Due to long hold times on the telephone, sending your provider a message by [facility] may be a faster and more efficient way to get a response.  Please allow 48 business hours for a response.  Please remember that this is for non-urgent requests.   Thank you for choosing me and [facility].  Kathleen Wall. Pleas Koch., MD., [facility]  Kegel Exercises  Kegel exercises can help strengthen your pelvic floor muscles. The pelvic floor is a group of muscles that support your rectum, small intestine, and bladder. In females, pelvic floor muscles also help support the uterus. These muscles help you control the flow of urine and stool (feces). Kegel exercises are painless and simple. They do not require any equipment. Your provider may suggest Kegel exercises to: Improve bladder and bowel control. Improve sexual response. Improve weak pelvic floor muscles after surgery to remove the uterus (hysterectomy) or after pregnancy, in females. Improve weak pelvic floor muscles after prostate gland removal or surgery, in males. Kegel exercises involve squeezing your pelvic floor muscles. These are the same muscles you squeeze when you try to stop the flow of urine or keep from passing gas. The exercises can be done while sitting, standing, or lying down, but it is best to vary your position. Ask your health care provider which exercises are safe for you. Do exercises exactly as told by your health care provider and adjust them as directed. Do not begin these exercises until told by your health care  provider. Exercises How to do Kegel exercises: Squeeze your pelvic floor muscles tight. You should feel a tight lift in your rectal area. If you are a female, you should also feel a tightness in your vaginal area. Keep your stomach, buttocks, and legs relaxed. Hold the muscles tight for up to 10 seconds. Breathe normally. Relax your muscles for up to 10 seconds. Repeat as told by your health care provider. Repeat this exercise daily as told by your health care provider. Continue to do this exercise for at least 4-6 weeks, or for as long as told by your health care provider. You may be referred to a physical therapist who can help you learn more about how to do Kegel exercises. Depending on your condition, your health care provider may recommend: Varying how long you squeeze your muscles. Doing several sets of exercises every day. Doing exercises for several weeks. Making Kegel exercises a part of your regular exercise routine. This information is not intended to replace advice given to you by your health care provider. Make sure you discuss any questions you have with your health care provider. Document Revised: 02/07/2021 Document Reviewed: 02/07/2021 Elsevier Patient Education  2024 [employer].

## 2023-08-21 NOTE — Therapy (Signed)
OUTPATIENT PHYSICAL THERAPY THORACOLUMBAR EVALUATION   Patient Name: Kathleen Wall MRN: [id number] DOB:04-06-1951, 72 y.o., female Today's Date: 08/21/2023  END OF SESSION:   Past Medical History:  Diagnosis Date   Arthritis    Asthma    Diverticular disease    Fibromyalgia    GERD (gastroesophageal reflux disease)    Hypercholesteremia    Hypothyroidism    Interstitial cystitis    Irritable bladder    Sleep apnea    Thyroid disease    TMJ (dislocation of temporomandibular joint)    left   Past Surgical History:  Procedure Laterality Date   ABDOMINAL HYSTERECTOMY     BREAST BIOPSY Right    over 10 yrs ago, Benign    BREAST SURGERY Right    benign   CATARACT EXTRACTION W/PHACO Left 08/25/2016   Procedure: CATARACT EXTRACTION PHACO AND INTRAOCULAR LENS PLACEMENT LEFT EYE CDE=10.19;  Surgeon: Gemma Payor, MD;  Location: [facility];  Service: Ophthalmology;  Laterality: Left;  left   CATARACT EXTRACTION W/PHACO Right 09/22/2016   Procedure: CATARACT EXTRACTION PHACO AND INTRAOCULAR LENS PLACEMENT RIGHT EYE CDE=9.79;  Surgeon: Gemma Payor, MD;  Location: [facility];  Service: Ophthalmology;  Laterality: Right;   CERVICAL FUSION     plate inserted   Patient Active Problem List   Diagnosis Date Noted   Dysphagia 03/13/2021   History of esophageal stricture 03/13/2021   Family history of colon cancer 03/13/2021   Constipation 03/13/2021   Diverticulosis 11/27/2011   Interstitial cystitis 11/27/2011    PCP: Lovey Newcomer, PA  REFERRING PROVIDER: Venita Lick, MD   REFERRING DIAG:  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  ONSET DATE: ***  SUBJECTIVE:                                                                                                                                                                                            SUBJECTIVE STATEMENT: ***  PERTINENT HISTORY:  ***  PAIN:  Are you having pain? {OPRCPAIN:27236}  PRECAUTIONS: {Therapy precautions:24002}  RED FLAGS: {PT Red Flags:29287}   WEIGHT BEARING RESTRICTIONS: {Yes ***/No:24003}  FALLS:  Has patient fallen in last 6 months? {fallsyesno:27318}  LIVING ENVIRONMENT: Lives with: {OPRC lives with:25569::"lives with their family"} Lives in: {Lives in:25570} Stairs: {opstairs:27293} Has following equipment at home: {Assistive devices:23999}  OCCUPATION: ***  PLOF: {PLOF:24004}  PATIENT GOALS: ***  NEXT MD VISIT: ***  OBJECTIVE:  Note: Objective measures were completed at Evaluation unless otherwise noted.  DIAGNOSTIC FINDINGS:  ***  PATIENT SURVEYS:  {  rehab surveys:24030}  SCREENING FOR RED FLAGS: Bowel or bladder incontinence: {Yes/No:304960894} Spinal tumors: {Yes/No:304960894} Cauda equina syndrome: {Yes/No:304960894} Compression fracture: {Yes/No:304960894} Abdominal aneurysm: {Yes/No:304960894}  COGNITION: Overall cognitive status: {cognition:24006}     SENSATION: {sensation:27233}  MUSCLE LENGTH: Hamstrings: Right *** deg; Left *** deg Thomas test: Right *** deg; Left *** deg  POSTURE: {posture:25561}  PALPATION: ***  LUMBAR ROM:   AROM eval  Flexion   Extension   Right lateral flexion   Left lateral flexion   Right rotation   Left rotation    (Blank rows = not tested)  LOWER EXTREMITY ROM:     {AROM/PROM:27142}  Right eval Left eval  Hip flexion    Hip extension    Hip abduction    Hip adduction    Hip internal rotation    Hip external rotation    Knee flexion    Knee extension    Ankle dorsiflexion    Ankle plantarflexion    Ankle inversion    Ankle eversion     (Blank rows = not tested)  LOWER EXTREMITY MMT:    MMT Right eval Left eval  Hip flexion    Hip extension    Hip abduction    Hip adduction    Hip internal rotation    Hip external rotation    Knee flexion     Knee extension    Ankle dorsiflexion    Ankle plantarflexion    Ankle inversion    Ankle eversion     (Blank rows = not tested)  LUMBAR SPECIAL TESTS:  {lumbar special test:25242}  FUNCTIONAL TESTS:  {Functional tests:24029}  GAIT: Distance walked: *** Assistive device utilized: {Assistive devices:23999} Level of assistance: {Levels of assistance:24026} Comments: ***  TODAY'S TREATMENT:                                                                                                                              DATE: 08/21/23    PATIENT EDUCATION:  Education details: *** Person educated: {Person educated:25204} Education method: {Education Method:25205} Education comprehension: {Education Comprehension:25206}  HOME EXERCISE PROGRAM: ***  ASSESSMENT:  CLINICAL IMPRESSION: Patient is a 72 y.o. female who was seen today for physical therapy evaluation and treatment for ***.   OBJECTIVE IMPAIRMENTS: {opptimpairments:25111}.   ACTIVITY LIMITATIONS: {activitylimitations:27494}  PARTICIPATION LIMITATIONS: {participationrestrictions:25113}  PERSONAL FACTORS: {Personal factors:25162} are also affecting patient's functional outcome.   REHAB POTENTIAL: {rehabpotential:25112}  CLINICAL DECISION MAKING: {clinical decision making:25114}  EVALUATION COMPLEXITY: {Evaluation complexity:25115}   GOALS: Goals reviewed with patient? Yes  SHORT TERM GOALS: Target date: 09/11/2023  *** Baseline: Goal status: INITIAL  2.  *** Baseline:  Goal status: INITIAL  3.  *** Baseline:  Goal status: INITIAL  4.  *** Baseline:  Goal status: INITIAL  5.  *** Baseline:  Goal status: INITIAL  6.  *** Baseline:  Goal status: INITIAL  LONG TERM GOALS: Target date: 10/02/2023   *** Baseline:  Goal status: INITIAL  2.  ***  Baseline:  Goal status: INITIAL  3.  *** Baseline:  Goal status: INITIAL  4.  *** Baseline:  Goal status: INITIAL  5.  *** Baseline:   Goal status: INITIAL  6.  *** Baseline:  Goal status: INITIAL  PLAN:  PT FREQUENCY: 1-2x/week  PT DURATION: 6 weeks  PLANNED INTERVENTIONS: {rehab planned interventions:25118::"97110-Therapeutic exercises","97530- Therapeutic [phone]- Neuromuscular re-education","97535- Self UVOZ","36644- Manual therapy"}.  PLAN FOR NEXT SESSION: ***   Viviann Spare, PT, DPT  08/21/2023, 12:49 PM

## 2023-08-24 ENCOUNTER — Encounter (HOSPITAL_COMMUNITY): Payer: Self-pay

## 2023-08-24 ENCOUNTER — Ambulatory Visit (HOSPITAL_COMMUNITY): Payer: Medicare HMO | Attending: Orthopedic Surgery

## 2023-08-24 DIAGNOSIS — M6281 Muscle weakness (generalized): Secondary | ICD-10-CM

## 2023-08-24 DIAGNOSIS — M5459 Other low back pain: Secondary | ICD-10-CM

## 2023-08-31 NOTE — Therapy (Signed)
OUTPATIENT PHYSICAL THERAPY THORACOLUMBAR TREATMENT   Patient Name: Kathleen Wall MRN: [id number] DOB:Jul 22, 1951, 72 y.o., female Today's Date: 09/01/2023  END OF SESSION:  PT End of Session - 09/01/23 1430     Visit Number 2    Number of Visits 12    Date for PT Re-Evaluation 10/05/23    Progress Note Due on Visit 10    PT Start Time 1431    PT Stop Time 1512    PT Time Calculation (min) 41 min    Activity Tolerance Patient tolerated treatment well    Behavior During Therapy WFL for tasks assessed/performed              Past Medical History:  Diagnosis Date   Arthritis    Asthma    Diverticular disease    Fibromyalgia    GERD (gastroesophageal reflux disease)    Hypercholesteremia    Hypothyroidism    Interstitial cystitis    Irritable bladder    Sleep apnea    Thyroid disease    TMJ (dislocation of temporomandibular joint)    left   Past Surgical History:  Procedure Laterality Date   ABDOMINAL HYSTERECTOMY     BREAST BIOPSY Right    over 10 yrs ago, Benign    BREAST SURGERY Right    benign   CATARACT EXTRACTION W/PHACO Left 08/25/2016   Procedure: CATARACT EXTRACTION PHACO AND INTRAOCULAR LENS PLACEMENT LEFT EYE CDE=10.19;  Surgeon: Gemma Payor, MD;  Location: [facility];  Service: Ophthalmology;  Laterality: Left;  left   CATARACT EXTRACTION W/PHACO Right 09/22/2016   Procedure: CATARACT EXTRACTION PHACO AND INTRAOCULAR LENS PLACEMENT RIGHT EYE CDE=9.79;  Surgeon: Gemma Payor, MD;  Location: [facility];  Service: Ophthalmology;  Laterality: Right;   CERVICAL FUSION     plate inserted   Patient Active Problem List   Diagnosis Date Noted   Dysphagia 03/13/2021   History of esophageal stricture 03/13/2021   Family history of colon cancer 03/13/2021   Constipation 03/13/2021   Diverticulosis 11/27/2011   Interstitial cystitis 11/27/2011    PCP: Lovey Newcomer, PA  REFERRING PROVIDER: Venita Lick, MD   REFERRING DIAG:  degenerative spondylolisthesis at  L5-S1 w/foraminal stenosis, neuropathic leg pain   Rationale for Evaluation and Treatment: Rehabilitation  THERAPY DIAG:  Muscle weakness (generalized)  Other low back pain  ONSET DATE: chronic   SUBJECTIVE:                                                                                                                                                                                           SUBJECTIVE STATEMENT: Patient reporting that her lower  lateral R leg is numb and went to Dr. Shon Baton. Has been through PT before for similar improvements. She has been to aquatic therapy for back pain >7 years ago. Avoids heavy lifting. Husband helps with housework due to back pain and potential injuring of the back. Has difficulty standing still. Has a BeActive brace that places pressure on sciatic nerve below knee which seemed to help for short period of time.   PERTINENT HISTORY:  Patient reports she was in the hospital >15 years when she found that she had a vertebrae that had slipped forward and pressing on nerves. Dr. Shon Baton suggested physical therapy prior to attempting surgery back then which helped and she has avoided back surgery since then. PMH: neck surgery   PAIN:  Are you having pain? Yes: NPRS scale: 2/10 Pain location: back Pain description: dull Aggravating factors: standing still, cleaning Relieving factors: heat, ibuprofen, muscle relaxers  PRECAUTIONS: None  RED FLAGS: None   WEIGHT BEARING RESTRICTIONS: No  FALLS:  Has patient fallen in last 6 months? No - reports tripping a lot and Dr. Shon Baton tells her that her R foot doesn't dorsiflex enough   LIVING ENVIRONMENT: Lives with: lives with their spouse Lives in: House/apartment  OCCUPATION: retired   PLOF: Independent  PATIENT GOALS: to help make the muscles stronger   NEXT MD VISIT: 3 months   OBJECTIVE:  Note: Objective measures were completed at Evaluation unless otherwise noted.  DIAGNOSTIC FINDINGS:  N/A    PATIENT SURVEYS:  Modified Oswestry to be completed visit #2    SCREENING FOR RED FLAGS: Bowel or bladder incontinence: No Spinal tumors: No Cauda equina syndrome: No Compression fracture: No Abdominal aneurysm: No  COGNITION: Overall cognitive status: Within functional limits for tasks assessed     SENSATION: Light touch: Impaired  - R lateral tibia with decreased sensation   MUSCLE LENGTH: Hamstrings: Right 35 deg; Left 65 deg  POSTURE: rounded shoulders and forward head  PALPATION: Tightness noted to lumbar paraspinals    LUMBAR ROM:   AROM eval  Flexion WFL  Extension WFL  Right lateral flexion WFL  Left lateral flexion WFL  Right rotation WFL  Left rotation WFL   (Blank rows = not tested)  LOWER EXTREMITY MMT:    MMT Right eval Left eval  Hip flexion 4- 4-  Hip extension    Hip abduction 4 4  Hip adduction 4+ 4+  Hip internal rotation    Hip external rotation    Knee flexion 4+ 4+  Knee extension 4+ 4+  Ankle dorsiflexion 4- 4+   (Blank rows = not tested)  LUMBAR SPECIAL TESTS:  To be assessed if needed on visit #2   FUNCTIONAL TESTS:  5 times sit to stand: 16.38 seconds  6 minute walk test: to be completed visit #2  GAIT: Distance walked: 20'  Assistive device utilized: None Level of assistance: Modified independence Comments: no obvious gait deviations noted   TODAY'S TREATMENT:  DATE: 09/01/23   Subjective: patient reports R leg hurting today (2/10). Completing HEP 2x/day most days.   Modified Oswestry: 44% :  877'    Manual: Supine general stretching (hamstring, piriformis) x 10 minutes Supine SLR with nerve glide on R x 10  TE:  Supine bridge 2 x 10  Supine marching 2 x 10 Supine hip abduction 2 x 10 with RTB around knees   Standing hip abduction with 2# AW 2 x 10 each LE Standing hip extension  with 2# AW 2 x 10 each LE Standing marching with 2# AW 2 x 10 each LE   PATIENT EDUCATION:  Education details: HEP, POC, goals  Person educated: Patient Education method: Explanation, Demonstration, and Handouts Education comprehension: verbalized understanding and returned demonstration  HOME EXERCISE PROGRAM: Access Code: [email or web address] URL: [email or web address] Date: 08/24/2023 Prepared by: Maylon Peppers  Exercises - Seated Hamstring Stretch  - 2-3 x daily - 5-7 x weekly - 3-5 reps - Seated Piriformis Stretch  - 2-3 x daily - 5-7 x weekly - 3-5 reps - Standing Hip Abduction with Counter Support  - 2-3 x daily - 5-7 x weekly - 3 sets - 10 reps - Standing Hip Extension with Counter Support  - 2-3 x daily - 5-7 x weekly - 3 sets - 10 reps  ASSESSMENT:  CLINICAL IMPRESSION:  Patient arrives to treatment session motivated to participate. Patient scored 44% on Modified Oswestry indicating moderate disability and ambulated 877' during with no rest break. Session focused on core and BLE strengthening as well as hamstring and piriformis stretching. Patient will benefit from skilled PT interventions to address listed impairments to improve quality of life and decrease pain.    OBJECTIVE IMPAIRMENTS: decreased endurance, difficulty walking, decreased strength, impaired flexibility, improper body mechanics, postural dysfunction, and pain.   ACTIVITY LIMITATIONS: lifting, bending, squatting, and stairs  PARTICIPATION LIMITATIONS: cleaning, laundry, and community activity  PERSONAL FACTORS: Age, Past/current experiences, and Time since onset of injury/illness/exacerbation are also affecting patient's functional outcome.   REHAB POTENTIAL: Good  CLINICAL DECISION MAKING: Stable/uncomplicated  EVALUATION COMPLEXITY: Low   GOALS: Goals reviewed with patient? Yes  SHORT TERM GOALS: Target date: 09/11/2023  Patient will be independent in HEP to improve strength/mobility  for better functional independence with ADLs. Baseline: Goal status: INITIAL   LONG TERM GOALS: Target date: 10/02/2023   Patient will reduce modified Oswestry score to <20 as to demonstrate minimal disability with ADLs including improved sleeping tolerance, walking/sitting tolerance etc for better mobility with ADLs. Baseline: 11/11: to be completed visit #2' 11/19: 44% Goal status: INITIAL  2.  Patient (> 50 years old) will complete five times sit to stand test in <12 seconds indicating an increased LE strength and improved balance. Baseline: 11/11: 16.38 seconds  Goal status: INITIAL  3.  Patient will increase BLE gross strength to 4+/5 as to improve functional strength for independent gait, increased standing tolerance and increased ADL ability. Baseline: 11/11: see above  Goal status: INITIAL  4.  Patient will increase six minute walk test distance by >200' for progression to improved community ambulator and improve gait ability Baseline: 11/11: to be completed visit #2; 11/19: 877'  Goal status: INITIAL  PLAN:  PT FREQUENCY: 1-2x/week  PT DURATION: 6 weeks  PLANNED INTERVENTIONS: 97164- PT Re-evaluation, 97110-Therapeutic exercises, 97530- Therapeutic activity, 97112- Neuromuscular re-education, 97535- Self Care, 95284- Manual therapy, 97014- Electrical stimulation (unattended), 13244- Electrical stimulation (manual), Patient/Family education, Balance training, Stair training, Joint mobilization, Joint manipulation, Spinal manipulation,  Spinal mobilization, Cryotherapy, and Moist heat.  PLAN FOR NEXT SESSION: HEP review, LE strengthening and stretching   Viviann Spare, PT, DPT  09/01/2023, 2:30 PM

## 2023-09-01 ENCOUNTER — Encounter (HOSPITAL_COMMUNITY): Payer: Self-pay

## 2023-09-01 ENCOUNTER — Ambulatory Visit (HOSPITAL_COMMUNITY): Payer: Medicare HMO

## 2023-09-01 DIAGNOSIS — M6281 Muscle weakness (generalized): Secondary | ICD-10-CM

## 2023-09-01 DIAGNOSIS — M5459 Other low back pain: Secondary | ICD-10-CM

## 2023-09-08 ENCOUNTER — Ambulatory Visit (HOSPITAL_COMMUNITY): Payer: Medicare HMO

## 2023-09-08 DIAGNOSIS — M6281 Muscle weakness (generalized): Secondary | ICD-10-CM | POA: Diagnosis not present

## 2023-09-08 DIAGNOSIS — M5459 Other low back pain: Secondary | ICD-10-CM

## 2023-09-08 NOTE — Therapy (Signed)
OUTPATIENT PHYSICAL THERAPY THORACOLUMBAR TREATMENT   Patient Name: Kathleen Wall MRN: [id number] DOB:1950-12-09, 72 y.o., female Today's Date: 09/08/2023  END OF SESSION:  PT End of Session - 09/08/23 1345     Visit Number 3    Number of Visits 12    Date for PT Re-Evaluation 10/05/23    Progress Note Due on Visit 10    PT Start Time 1345    PT Stop Time 1426    PT Time Calculation (min) 41 min    Activity Tolerance Patient tolerated treatment well    Behavior During Therapy WFL for tasks assessed/performed               Past Medical History:  Diagnosis Date   Arthritis    Asthma    Diverticular disease    Fibromyalgia    GERD (gastroesophageal reflux disease)    Hypercholesteremia    Hypothyroidism    Interstitial cystitis    Irritable bladder    Sleep apnea    Thyroid disease    TMJ (dislocation of temporomandibular joint)    left   Past Surgical History:  Procedure Laterality Date   ABDOMINAL HYSTERECTOMY     BREAST BIOPSY Right    over 10 yrs ago, Benign    BREAST SURGERY Right    benign   CATARACT EXTRACTION W/PHACO Left 08/25/2016   Procedure: CATARACT EXTRACTION PHACO AND INTRAOCULAR LENS PLACEMENT LEFT EYE CDE=10.19;  Surgeon: Gemma Payor, MD;  Location: [facility];  Service: Ophthalmology;  Laterality: Left;  left   CATARACT EXTRACTION W/PHACO Right 09/22/2016   Procedure: CATARACT EXTRACTION PHACO AND INTRAOCULAR LENS PLACEMENT RIGHT EYE CDE=9.79;  Surgeon: Gemma Payor, MD;  Location: [facility];  Service: Ophthalmology;  Laterality: Right;   CERVICAL FUSION     plate inserted   Patient Active Problem List   Diagnosis Date Noted   Dysphagia 03/13/2021   History of esophageal stricture 03/13/2021   Family history of colon cancer 03/13/2021   Constipation 03/13/2021   Diverticulosis 11/27/2011   Interstitial cystitis 11/27/2011    PCP: Lovey Newcomer, PA  REFERRING PROVIDER: Venita Lick, MD   REFERRING DIAG:  degenerative spondylolisthesis  at L5-S1 w/foraminal stenosis, neuropathic leg pain   Rationale for Evaluation and Treatment: Rehabilitation  THERAPY DIAG:  Muscle weakness (generalized)  Other low back pain  ONSET DATE: chronic   SUBJECTIVE:                                                                                                                                                                                           SUBJECTIVE STATEMENT: Patient reporting that her  lower lateral R leg is numb and went to Dr. Shon Baton. Has been through PT before for similar improvements. She has been to aquatic therapy for back pain >7 years ago. Avoids heavy lifting. Husband helps with housework due to back pain and potential injuring of the back. Has difficulty standing still. Has a BeActive brace that places pressure on sciatic nerve below knee which seemed to help for short period of time.   PERTINENT HISTORY:  Patient reports she was in the hospital >15 years when she found that she had a vertebrae that had slipped forward and pressing on nerves. Dr. Shon Baton suggested physical therapy prior to attempting surgery back then which helped and she has avoided back surgery since then. PMH: neck surgery   PAIN:  Are you having pain? Yes: NPRS scale: 2/10 Pain location: back Pain description: dull Aggravating factors: standing still, cleaning Relieving factors: heat, ibuprofen, muscle relaxers  PRECAUTIONS: None  RED FLAGS: None   WEIGHT BEARING RESTRICTIONS: No  FALLS:  Has patient fallen in last 6 months? No - reports tripping a lot and Dr. Shon Baton tells her that her R foot doesn't dorsiflex enough   LIVING ENVIRONMENT: Lives with: lives with their spouse Lives in: House/apartment  OCCUPATION: retired   PLOF: Independent  PATIENT GOALS: to help make the muscles stronger   NEXT MD VISIT: 3 months   OBJECTIVE:  Note: Objective measures were completed at Evaluation unless otherwise noted.  DIAGNOSTIC FINDINGS:   N/A   PATIENT SURVEYS:  Modified Oswestry to be completed visit #2    SCREENING FOR RED FLAGS: Bowel or bladder incontinence: No Spinal tumors: No Cauda equina syndrome: No Compression fracture: No Abdominal aneurysm: No  COGNITION: Overall cognitive status: Within functional limits for tasks assessed     SENSATION: Light touch: Impaired  - R lateral tibia with decreased sensation   MUSCLE LENGTH: Hamstrings: Right 35 deg; Left 65 deg  POSTURE: rounded shoulders and forward head  PALPATION: Tightness noted to lumbar paraspinals    LUMBAR ROM:   AROM eval  Flexion WFL  Extension WFL  Right lateral flexion WFL  Left lateral flexion WFL  Right rotation WFL  Left rotation WFL   (Blank rows = not tested)  LOWER EXTREMITY MMT:    MMT Right eval Left eval  Hip flexion 4- 4-  Hip extension    Hip abduction 4 4  Hip adduction 4+ 4+  Hip internal rotation    Hip external rotation    Knee flexion 4+ 4+  Knee extension 4+ 4+  Ankle dorsiflexion 4- 4+   (Blank rows = not tested)  LUMBAR SPECIAL TESTS:  To be assessed if needed on visit #2   FUNCTIONAL TESTS:  5 times sit to stand: 16.38 seconds  6 minute walk test: to be completed visit #2  GAIT: Distance walked: 20'  Assistive device utilized: None Level of assistance: Modified independence Comments: no obvious gait deviations noted   TODAY'S TREATMENT:  DATE: 09/08/23    Subjective: Patient reports that her R hip and leg are really hurting recently that started Wednesday (11/20) after getting out of the car at church.   Manual: Supine general stretching (hamstring, piriformis, knee to chest) x 10 minutes Prone STM to lumbar paraspinals and gluteus/piriformis x 5 minutes   TE:  Nustep level 2 x 6 minutes  Supine bridge 2 x 10  Prone femoral nerve glide x 10 Seated marching  with RTB 2 x 10   Seated slump nerve glide x 5  PATIENT EDUCATION:  Education details: HEP, POC, goals  Person educated: Patient Education method: Explanation, Demonstration, and Handouts Education comprehension: verbalized understanding and returned demonstration  HOME EXERCISE PROGRAM: Access Code: [email or web address] URL: [email or web address] Date: 08/24/2023 Prepared by: Maylon Peppers  Exercises - Seated Hamstring Stretch  - 2-3 x daily - 5-7 x weekly - 3-5 reps - Seated Piriformis Stretch  - 2-3 x daily - 5-7 x weekly - 3-5 reps - Standing Hip Abduction with Counter Support  - 2-3 x daily - 5-7 x weekly - 3 sets - 10 reps - Standing Hip Extension with Counter Support  - 2-3 x daily - 5-7 x weekly - 3 sets - 10 reps  ASSESSMENT:  CLINICAL IMPRESSION:    Patient arrives to treatment session with increased pain in her R hip and R sided low back. Session focused on manual stretching and core/BLE strengthening. Tolerated session well with no changes in pain. Patient will continue to benefit from skilled therapy to address remaining deficits in order to improve quality of life and return to PLOF.   OBJECTIVE IMPAIRMENTS: decreased endurance, difficulty walking, decreased strength, impaired flexibility, improper body mechanics, postural dysfunction, and pain.   ACTIVITY LIMITATIONS: lifting, bending, squatting, and stairs  PARTICIPATION LIMITATIONS: cleaning, laundry, and community activity  PERSONAL FACTORS: Age, Past/current experiences, and Time since onset of injury/illness/exacerbation are also affecting patient's functional outcome.   REHAB POTENTIAL: Good  CLINICAL DECISION MAKING: Stable/uncomplicated  EVALUATION COMPLEXITY: Low   GOALS: Goals reviewed with patient? Yes  SHORT TERM GOALS: Target date: 09/11/2023  Patient will be independent in HEP to improve strength/mobility for better functional independence with ADLs. Baseline: Goal status:  INITIAL   LONG TERM GOALS: Target date: 10/02/2023   Patient will reduce modified Oswestry score to <20 as to demonstrate minimal disability with ADLs including improved sleeping tolerance, walking/sitting tolerance etc for better mobility with ADLs. Baseline: 11/11: to be completed visit #2' 11/19: 44% Goal status: INITIAL  2.  Patient (> 73 years old) will complete five times sit to stand test in <12 seconds indicating an increased LE strength and improved balance. Baseline: 11/11: 16.38 seconds  Goal status: INITIAL  3.  Patient will increase BLE gross strength to 4+/5 as to improve functional strength for independent gait, increased standing tolerance and increased ADL ability. Baseline: 11/11: see above  Goal status: INITIAL  4.  Patient will increase six minute walk test distance by >200' for progression to improved community ambulator and improve gait ability Baseline: 11/11: to be completed visit #2; 11/19: 877'  Goal status: INITIAL  PLAN:  PT FREQUENCY: 1-2x/week  PT DURATION: 6 weeks  PLANNED INTERVENTIONS: 97164- PT Re-evaluation, 97110-Therapeutic exercises, 97530- Therapeutic activity, 97112- Neuromuscular re-education, 97535- Self Care, 52841- Manual therapy, 97014- Electrical stimulation (unattended), 32440- Electrical stimulation (manual), Patient/Family education, Balance training, Stair training, Joint mobilization, Joint manipulation, Spinal manipulation, Spinal mobilization, Cryotherapy, and Moist heat.  PLAN FOR NEXT SESSION: HEP review, LE strengthening  and stretching   Viviann Spare, PT, DPT  09/08/2023, 1:46 PM

## 2023-09-15 ENCOUNTER — Ambulatory Visit (HOSPITAL_COMMUNITY): Payer: Medicare HMO | Attending: Orthopedic Surgery

## 2023-09-15 DIAGNOSIS — M6281 Muscle weakness (generalized): Secondary | ICD-10-CM | POA: Diagnosis present

## 2023-09-15 DIAGNOSIS — M5459 Other low back pain: Secondary | ICD-10-CM | POA: Insufficient documentation

## 2023-09-15 NOTE — Therapy (Cosign Needed)
OUTPATIENT PHYSICAL THERAPY THORACOLUMBAR TREATMENT   Patient Name: Kathleen Wall MRN: [id number] DOB:02-09-1951, 72 y.o., female Today's Date: 09/15/2023  END OF SESSION:  PT End of Session - 09/15/23 1301     Visit Number 4    Number of Visits 12    Date for PT Re-Evaluation 10/05/23    Authorization Type Medicare    Progress Note Due on Visit 10    PT Start Time 1302    PT Stop Time 1345    PT Time Calculation (min) 43 min    Activity Tolerance Patient tolerated treatment well    Behavior During Therapy WFL for tasks assessed/performed               Past Medical History:  Diagnosis Date   Arthritis    Asthma    Diverticular disease    Fibromyalgia    GERD (gastroesophageal reflux disease)    Hypercholesteremia    Hypothyroidism    Interstitial cystitis    Irritable bladder    Sleep apnea    Thyroid disease    TMJ (dislocation of temporomandibular joint)    left   Past Surgical History:  Procedure Laterality Date   ABDOMINAL HYSTERECTOMY     BREAST BIOPSY Right    over 10 yrs ago, Benign    BREAST SURGERY Right    benign   CATARACT EXTRACTION W/PHACO Left 08/25/2016   Procedure: CATARACT EXTRACTION PHACO AND INTRAOCULAR LENS PLACEMENT LEFT EYE CDE=10.19;  Surgeon: Gemma Payor, MD;  Location: [facility];  Service: Ophthalmology;  Laterality: Left;  left   CATARACT EXTRACTION W/PHACO Right 09/22/2016   Procedure: CATARACT EXTRACTION PHACO AND INTRAOCULAR LENS PLACEMENT RIGHT EYE CDE=9.79;  Surgeon: Gemma Payor, MD;  Location: [facility];  Service: Ophthalmology;  Laterality: Right;   CERVICAL FUSION     plate inserted   Patient Active Problem List   Diagnosis Date Noted   Dysphagia 03/13/2021   History of esophageal stricture 03/13/2021   Family history of colon cancer 03/13/2021   Constipation 03/13/2021   Diverticulosis 11/27/2011   Interstitial cystitis 11/27/2011    PCP: Lovey Newcomer, PA  REFERRING PROVIDER: Venita Lick, MD   REFERRING DIAG:   degenerative spondylolisthesis at L5-S1 w/foraminal stenosis, neuropathic leg pain   Rationale for Evaluation and Treatment: Rehabilitation  THERAPY DIAG:  Muscle weakness (generalized)  Other low back pain  ONSET DATE: chronic   SUBJECTIVE:  SUBJECTIVE STATEMENT: Patient reports that some days have been better than others since last session. Standing for longer periods really exaggerates her pain. Right hip pain sometimes worse than back pain. Recently, only one day of shooting pain. Has not been doing bridges due to severe pain; otherwise still doing HEP.  EVAL: Patient reporting that her lower lateral R leg is numb and went to Dr. Shon Baton. Has been through PT before for similar improvements. She has been to aquatic therapy for back pain >7 years ago. Avoids heavy lifting. Husband helps with housework due to back pain and potential injuring of the back. Has difficulty standing still. Has a BeActive brace that places pressure on sciatic nerve below knee which seemed to help for short period of time.   PERTINENT HISTORY:  Patient reports she was in the hospital >15 years when she found that she had a vertebrae that had slipped forward and pressing on nerves. Dr. Shon Baton suggested physical therapy prior to attempting surgery back then which helped and she has avoided back surgery since then. PMH: neck surgery   PAIN:  Are you having pain? Yes: NPRS scale: 2/10 Pain location: back Pain description: dull Aggravating factors: standing still, cleaning Relieving factors: heat, ibuprofen, muscle relaxers  PRECAUTIONS: None  RED FLAGS: None   WEIGHT BEARING RESTRICTIONS: No  FALLS:  Has patient fallen in last 6 months? No - reports tripping a lot and Dr. Shon Baton tells her that her R foot doesn't dorsiflex  enough   LIVING ENVIRONMENT: Lives with: lives with their spouse Lives in: House/apartment  OCCUPATION: retired   PLOF: Independent  PATIENT GOALS: to help make the muscles stronger   NEXT MD VISIT: 3 months   OBJECTIVE:  Note: Objective measures were completed at Evaluation unless otherwise noted.  DIAGNOSTIC FINDINGS:  N/A   PATIENT SURVEYS:  Modified Oswestry 44%   SCREENING FOR RED FLAGS: Bowel or bladder incontinence: No Spinal tumors: No Cauda equina syndrome: No Compression fracture: No Abdominal aneurysm: No  COGNITION: Overall cognitive status: Within functional limits for tasks assessed     SENSATION: Light touch: Impaired  - R lateral tibia with decreased sensation   MUSCLE LENGTH: Hamstrings: Right 35 deg; Left 65 deg  POSTURE: rounded shoulders and forward head  PALPATION: Tightness noted to lumbar paraspinals    LUMBAR ROM:   AROM eval  Flexion WFL  Extension WFL  Right lateral flexion WFL  Left lateral flexion WFL  Right rotation WFL  Left rotation WFL   (Blank rows = not tested)  LOWER EXTREMITY MMT:    MMT Right eval Left eval  Hip flexion 4- 4-  Hip extension    Hip abduction 4 4  Hip adduction 4+ 4+  Hip internal rotation    Hip external rotation    Knee flexion 4+ 4+  Knee extension 4+ 4+  Ankle dorsiflexion 4- 4+   (Blank rows = not tested)  LUMBAR SPECIAL TESTS:     FUNCTIONAL TESTS:  5 times sit to stand: 16.38 seconds  6 minute walk test: 877 feet  GAIT: Distance walked: 20'  Assistive device utilized: None Level of assistance: Modified independence Comments: no obvious gait deviations noted   TODAY'S TREATMENT:  DATE: 09/15/23    TE:  Moist heat in supine 5' Supine nerve glides 2 x 10 Supine marches 2 x 20 Supine SLR 2 x 10  Supine piriformis stretch 3 x 20 sec  Nustep level 2  x 6 minutes     PATIENT EDUCATION:  Education details: HEP, POC, goals  Person educated: Patient Education method: Explanation, Demonstration, and Handouts Education comprehension: verbalized understanding and returned demonstration  HOME EXERCISE PROGRAM: Access Code: [email or web address] URL: [email or web address] Date: 08/24/2023 Prepared by: Maylon Peppers  Exercises - Seated Hamstring Stretch  - 2-3 x daily - 5-7 x weekly - 3-5 reps - Seated Piriformis Stretch  - 2-3 x daily - 5-7 x weekly - 3-5 reps - Standing Hip Abduction with Counter Support  - 2-3 x daily - 5-7 x weekly - 3 sets - 10 reps - Standing Hip Extension with Counter Support  - 2-3 x daily - 5-7 x weekly - 3 sets - 10 reps  ASSESSMENT:  CLINICAL IMPRESSION:    Patient arrives to treatment session with fluctuating pain in her R hip and R sided low back. Session started with moist heat in supine to reduce right sided back pain. Focused on supine stretching and BLE strengthening to decrease spinal compression. Left SLR caused an increase in right sided LBP to 5/10; slightly more tolerable when cued for abdominal bracing. Pain did not linger and reduced once exercise stopped. Pt demonstrated bridge that is painful; PT cued to decrease ROM and focus on glute squeeze. Pain did not raise above 3-4/10 and pt was better able to tolerated bridging after verbal cues. Patient will continue to benefit from skilled therapy to address remaining deficits in order to improve quality of life and return to PLOF.   OBJECTIVE IMPAIRMENTS: decreased endurance, difficulty walking, decreased strength, impaired flexibility, improper body mechanics, postural dysfunction, and pain.   ACTIVITY LIMITATIONS: lifting, bending, squatting, and stairs  PARTICIPATION LIMITATIONS: cleaning, laundry, and community activity  PERSONAL FACTORS: Age, Past/current experiences, and Time since onset of injury/illness/exacerbation are also affecting patient's  functional outcome.   REHAB POTENTIAL: Good  CLINICAL DECISION MAKING: Stable/uncomplicated  EVALUATION COMPLEXITY: Low   GOALS: Goals reviewed with patient? Yes  SHORT TERM GOALS: Target date: 09/11/2023  Patient will be independent in HEP to improve strength/mobility for better functional independence with ADLs. Baseline: Goal status: INITIAL   LONG TERM GOALS: Target date: 10/02/2023   Patient will reduce modified Oswestry score to <20 as to demonstrate minimal disability with ADLs including improved sleeping tolerance, walking/sitting tolerance etc for better mobility with ADLs. Baseline: 11/11: to be completed visit #2' 11/19: 44% Goal status: INITIAL  2.  Patient (> 48 years old) will complete five times sit to stand test in <12 seconds indicating an increased LE strength and improved balance. Baseline: 11/11: 16.38 seconds  Goal status: INITIAL  3.  Patient will increase BLE gross strength to 4+/5 as to improve functional strength for independent gait, increased standing tolerance and increased ADL ability. Baseline: 11/11: see above  Goal status: INITIAL  4.  Patient will increase six minute walk test distance by >200' for progression to improved community ambulator and improve gait ability Baseline: 11/19: 877'  Goal status: INITIAL  PLAN:  PT FREQUENCY: 1-2x/week  PT DURATION: 6 weeks  PLANNED INTERVENTIONS: 97164- PT Re-evaluation, 97110-Therapeutic exercises, 97530- Therapeutic activity, 97112- Neuromuscular re-education, 97535- Self Care, 78295- Manual therapy, 97014- Electrical stimulation (unattended), [phone]- Electrical stimulation (manual), Patient/Family education, Balance training, Stair training, Joint mobilization, Joint manipulation,  Spinal manipulation, Spinal mobilization, Cryotherapy, and Moist heat.  PLAN FOR NEXT SESSION: LE strengthening and stretching; introduce lumbar decompression exercises; cue for core control and bracing during  exercise.   Chas Demesha Boorman, Student-PT 09/15/2023, 1:03 PM  " I agree with the following treatment note after reviewing documentation. This session was performed under the supervision of a licensed clinician."    7:03 AM, 09/16/23 Amy Small Lynch MPT [facility] [place] [phone]

## 2023-09-22 ENCOUNTER — Ambulatory Visit (HOSPITAL_COMMUNITY): Payer: Medicare HMO

## 2023-09-22 ENCOUNTER — Encounter (HOSPITAL_COMMUNITY): Payer: Self-pay

## 2023-09-22 DIAGNOSIS — M6281 Muscle weakness (generalized): Secondary | ICD-10-CM

## 2023-09-22 DIAGNOSIS — M5459 Other low back pain: Secondary | ICD-10-CM

## 2023-09-22 NOTE — Therapy (Signed)
OUTPATIENT PHYSICAL THERAPY THORACOLUMBAR TREATMENT   Patient Name: Kathleen Wall MRN: [id number] DOB:1951-05-13, 72 y.o., female Today's Date: 09/22/2023  END OF SESSION:  PT End of Session - 09/22/23 1145     Visit Number 5    Number of Visits 12    Date for PT Re-Evaluation 10/05/23    Progress Note Due on Visit 10    PT Start Time 1146    PT Stop Time 1226    PT Time Calculation (min) 40 min    Activity Tolerance Patient tolerated treatment well    Behavior During Therapy WFL for tasks assessed/performed               Past Medical History:  Diagnosis Date   Arthritis    Asthma    Diverticular disease    Fibromyalgia    GERD (gastroesophageal reflux disease)    Hypercholesteremia    Hypothyroidism    Interstitial cystitis    Irritable bladder    Sleep apnea    Thyroid disease    TMJ (dislocation of temporomandibular joint)    left   Past Surgical History:  Procedure Laterality Date   ABDOMINAL HYSTERECTOMY     BREAST BIOPSY Right    over 10 yrs ago, Benign    BREAST SURGERY Right    benign   CATARACT EXTRACTION W/PHACO Left 08/25/2016   Procedure: CATARACT EXTRACTION PHACO AND INTRAOCULAR LENS PLACEMENT LEFT EYE CDE=10.19;  Surgeon: Gemma Payor, MD;  Location: [facility];  Service: Ophthalmology;  Laterality: Left;  left   CATARACT EXTRACTION W/PHACO Right 09/22/2016   Procedure: CATARACT EXTRACTION PHACO AND INTRAOCULAR LENS PLACEMENT RIGHT EYE CDE=9.79;  Surgeon: Gemma Payor, MD;  Location: [facility];  Service: Ophthalmology;  Laterality: Right;   CERVICAL FUSION     plate inserted   Patient Active Problem List   Diagnosis Date Noted   Dysphagia 03/13/2021   History of esophageal stricture 03/13/2021   Family history of colon cancer 03/13/2021   Constipation 03/13/2021   Diverticulosis 11/27/2011   Interstitial cystitis 11/27/2011    PCP: Lovey Newcomer, PA  REFERRING PROVIDER: Venita Lick, MD   REFERRING DIAG:  degenerative spondylolisthesis  at L5-S1 w/foraminal stenosis, neuropathic leg pain   Rationale for Evaluation and Treatment: Rehabilitation  THERAPY DIAG:  Muscle weakness (generalized)  Other low back pain  ONSET DATE: chronic   SUBJECTIVE:                                                                                                                                                                                           SUBJECTIVE STATEMENT: Pt reports she had  increased pain yesterday, stated she completed the HEP and reports of decrease radicular symptoms.  LBP low today, does have radicular symptoms ending at Rt ankle.  EVAL: Patient reporting that her lower lateral R leg is numb and went to Dr. Shon Baton. Has been through PT before for similar improvements. She has been to aquatic therapy for back pain >7 years ago. Avoids heavy lifting. Husband helps with housework due to back pain and potential injuring of the back. Has difficulty standing still. Has a [employer] brace that places pressure on sciatic nerve below knee which seemed to help for short period of time.   PERTINENT HISTORY:  Patient reports she was in the hospital >15 years when she found that she had a vertebrae that had slipped forward and pressing on nerves. Dr. Shon Baton suggested physical therapy prior to attempting surgery back then which helped and she has avoided back surgery since then. PMH: neck surgery   PAIN:  Are you having pain? Yes: NPRS scale: 2/10 Pain location: back Pain description: dull Aggravating factors: standing still, cleaning Relieving factors: heat, ibuprofen, muscle relaxers  PRECAUTIONS: None  RED FLAGS: None   WEIGHT BEARING RESTRICTIONS: No  FALLS:  Has patient fallen in last 6 months? No - reports tripping a lot and Dr. Shon Baton tells her that her R foot doesn't dorsiflex enough   LIVING ENVIRONMENT: Lives with: lives with their spouse Lives in: House/apartment  OCCUPATION: retired   PLOF: Independent  PATIENT  GOALS: to help make the muscles stronger   NEXT MD VISIT: 3 months   OBJECTIVE:  Note: Objective measures were completed at Evaluation unless otherwise noted.  DIAGNOSTIC FINDINGS:  N/A   PATIENT SURVEYS:  Modified Oswestry 44%   SCREENING FOR RED FLAGS: Bowel or bladder incontinence: No Spinal tumors: No Cauda equina syndrome: No Compression fracture: No Abdominal aneurysm: No  COGNITION: Overall cognitive status: Within functional limits for tasks assessed     SENSATION: Light touch: Impaired  - R lateral tibia with decreased sensation   MUSCLE LENGTH: Hamstrings: Right 35 deg; Left 65 deg  POSTURE: rounded shoulders and forward head  PALPATION: Tightness noted to lumbar paraspinals    LUMBAR ROM:   AROM eval  Flexion WFL  Extension WFL  Right lateral flexion WFL  Left lateral flexion WFL  Right rotation WFL  Left rotation WFL   (Blank rows = not tested)  LOWER EXTREMITY MMT:    MMT Right eval Left eval  Hip flexion 4- 4-  Hip extension    Hip abduction 4 4  Hip adduction 4+ 4+  Hip internal rotation    Hip external rotation    Knee flexion 4+ 4+  Knee extension 4+ 4+  Ankle dorsiflexion 4- 4+   (Blank rows = not tested)  LUMBAR SPECIAL TESTS:     FUNCTIONAL TESTS:  5 times sit to stand: 16.38 seconds  6 minute walk test: 877 feet  GAIT: Distance walked: 20'  Assistive device utilized: None Level of assistance: Modified independence Comments: no obvious gait deviations noted   TODAY'S TREATMENT:  DATE:  09/22/23  Supine: Decompression exercises 5x 5" TrA 5x 5" March with ab set 10x Hamstring stretch 2x 30" Piriformis stretch 2x 30" 90/90 position LTR 5 x10" Bridge partial range 10x   Prone POE x 2 minutes: Reports of increased hip and lateral radicular symptoms   09/15/23: TE:  Moist heat in supine  5' Supine nerve glides 2 x 10 Supine marches 2 x 20 Supine SLR 2 x 10  Supine piriformis stretch 3 x 20 sec  Nustep level 2 x 6 minutes     PATIENT EDUCATION:  Education details: HEP, POC, goals  Person educated: Patient Education method: Explanation, Demonstration, and Handouts Education comprehension: verbalized understanding and returned demonstration  HOME EXERCISE PROGRAM: Access Code: [email or web address] URL: [email or web address] Date: 08/24/2023 Prepared by: Maylon Peppers  Exercises - Seated Hamstring Stretch  - 2-3 x daily - 5-7 x weekly - 3-5 reps - Seated Piriformis Stretch  - 2-3 x daily - 5-7 x weekly - 3-5 reps - Standing Hip Abduction with Counter Support  - 2-3 x daily - 5-7 x weekly - 3 sets - 10 reps - Standing Hip Extension with Counter Support  - 2-3 x daily - 5-7 x weekly - 3 sets - 10 reps\  09/22/23: decompression  ASSESSMENT:  CLINICAL IMPRESSION:   Session focus with core and proximal strengthening.  Added decompression exercises for posterior chain strengthening.  Trial with prone exercises with reports of increased Rt hip and lateral radicular symptoms to knee.  Stretches complete at EOS with reports of pain reduced.  OBJECTIVE IMPAIRMENTS: decreased endurance, difficulty walking, decreased strength, impaired flexibility, improper body mechanics, postural dysfunction, and pain.   ACTIVITY LIMITATIONS: lifting, bending, squatting, and stairs  PARTICIPATION LIMITATIONS: cleaning, laundry, and community activity  PERSONAL FACTORS: Age, Past/current experiences, and Time since onset of injury/illness/exacerbation are also affecting patient's functional outcome.   REHAB POTENTIAL: Good  CLINICAL DECISION MAKING: Stable/uncomplicated  EVALUATION COMPLEXITY: Low   GOALS: Goals reviewed with patient? Yes  SHORT TERM GOALS: Target date: 09/11/2023  Patient will be independent in HEP to improve strength/mobility for better functional  independence with ADLs. Baseline: Goal status: INITIAL   LONG TERM GOALS: Target date: 10/02/2023   Patient will reduce modified Oswestry score to <20 as to demonstrate minimal disability with ADLs including improved sleeping tolerance, walking/sitting tolerance etc for better mobility with ADLs. Baseline: 11/11: to be completed visit #2' 11/19: 44% Goal status: INITIAL  2.  Patient (> 45 years old) will complete five times sit to stand test in <12 seconds indicating an increased LE strength and improved balance. Baseline: 11/11: 16.38 seconds  Goal status: INITIAL  3.  Patient will increase BLE gross strength to 4+/5 as to improve functional strength for independent gait, increased standing tolerance and increased ADL ability. Baseline: 11/11: see above  Goal status: INITIAL  4.  Patient will increase six minute walk test distance by >200' for progression to improved community ambulator and improve gait ability Baseline: 11/19: 877'  Goal status: INITIAL  PLAN:  PT FREQUENCY: 1-2x/week  PT DURATION: 6 weeks  PLANNED INTERVENTIONS: 97164- PT Re-evaluation, 97110-Therapeutic exercises, 97530- Therapeutic activity, 97112- Neuromuscular re-education, 97535- Self Care, 19147- Manual therapy, 97014- Electrical stimulation (unattended), [phone]- Electrical stimulation (manual), Patient/Family education, Balance training, Stair training, Joint mobilization, Joint manipulation, Spinal manipulation, Spinal mobilization, Cryotherapy, and Moist heat.  PLAN FOR NEXT SESSION: LE strengthening and stretching; introduce lumbar decompression exercises; cue for core control and bracing during exercise.  Becky Sax, LPTA/CLT; [facility] [phone]  Juel Burrow, PTA 09/22/2023, 1:50 PM     1:50 PM, 09/22/23

## 2023-09-29 ENCOUNTER — Encounter (HOSPITAL_COMMUNITY): Payer: Medicare HMO

## 2023-09-30 ENCOUNTER — Encounter (HOSPITAL_COMMUNITY): Payer: Medicare HMO

## 2023-10-13 ENCOUNTER — Encounter (HOSPITAL_COMMUNITY): Payer: Self-pay

## 2023-10-13 ENCOUNTER — Encounter (HOSPITAL_COMMUNITY): Payer: Medicare HMO

## 2023-10-13 NOTE — Therapy (Signed)
 PHYSICAL THERAPY DISCHARGE SUMMARY  Visits from Start of Care: 5  Current functional level related to goals / functional outcomes: unknown   Remaining deficits: unknown   Education / Equipment: HEP   Patient agrees to discharge. Patient goals were not met. Patient is being discharged due to the patient's request.
